# Patient Record
Sex: Male | Born: 1960 | Smoking: Former smoker
Health system: Southern US, Community
[De-identification: ages and names within clinical notes are randomized; demographics above are authoritative.]

## PROBLEM LIST (undated history)

## (undated) DIAGNOSIS — F419 Anxiety disorder, unspecified: Secondary | ICD-10-CM

## (undated) DIAGNOSIS — F329 Major depressive disorder, single episode, unspecified: Secondary | ICD-10-CM

## (undated) DIAGNOSIS — E785 Hyperlipidemia, unspecified: Secondary | ICD-10-CM

## (undated) DIAGNOSIS — F32A Depression, unspecified: Secondary | ICD-10-CM

## (undated) DIAGNOSIS — I10 Essential (primary) hypertension: Secondary | ICD-10-CM

## (undated) DIAGNOSIS — E119 Type 2 diabetes mellitus without complications: Secondary | ICD-10-CM

## (undated) HISTORY — DX: Essential (primary) hypertension: I10

## (undated) HISTORY — PX: PTERYGIUM EXCISION: SHX2273

## (undated) HISTORY — DX: Depression, unspecified: F32.A

## (undated) HISTORY — DX: Major depressive disorder, single episode, unspecified: F32.9

## (undated) HISTORY — DX: Hyperlipidemia, unspecified: E78.5

## (undated) HISTORY — DX: Anxiety disorder, unspecified: F41.9

## (undated) HISTORY — DX: Type 2 diabetes mellitus without complications: E11.9

---

## 2014-12-15 DIAGNOSIS — E785 Hyperlipidemia, unspecified: Secondary | ICD-10-CM | POA: Insufficient documentation

## 2014-12-15 DIAGNOSIS — I10 Essential (primary) hypertension: Secondary | ICD-10-CM | POA: Insufficient documentation

## 2014-12-15 DIAGNOSIS — F419 Anxiety disorder, unspecified: Secondary | ICD-10-CM | POA: Insufficient documentation

## 2014-12-15 DIAGNOSIS — E1142 Type 2 diabetes mellitus with diabetic polyneuropathy: Secondary | ICD-10-CM | POA: Insufficient documentation

## 2014-12-15 DIAGNOSIS — F339 Major depressive disorder, recurrent, unspecified: Secondary | ICD-10-CM | POA: Insufficient documentation

## 2014-12-23 ENCOUNTER — Ambulatory Visit (INDEPENDENT_AMBULATORY_CARE_PROVIDER_SITE_OTHER): Payer: BC Managed Care – PPO | Admitting: Family Medicine

## 2014-12-23 ENCOUNTER — Encounter: Payer: Self-pay | Admitting: Family Medicine

## 2014-12-23 VITALS — BP 136/81 | HR 58 | Temp 98.0°F | Ht 71.0 in | Wt 215.0 lb

## 2014-12-23 DIAGNOSIS — R972 Elevated prostate specific antigen [PSA]: Secondary | ICD-10-CM

## 2014-12-23 DIAGNOSIS — E119 Type 2 diabetes mellitus without complications: Secondary | ICD-10-CM | POA: Diagnosis not present

## 2014-12-23 DIAGNOSIS — E785 Hyperlipidemia, unspecified: Secondary | ICD-10-CM | POA: Diagnosis not present

## 2014-12-23 DIAGNOSIS — I10 Essential (primary) hypertension: Secondary | ICD-10-CM | POA: Diagnosis not present

## 2014-12-23 LAB — BAYER DCA HB A1C WAIVED: HB A1C (BAYER DCA - WAIVED): 6.1 % (ref <7.0)

## 2014-12-23 MED ORDER — ALBIGLUTIDE 50 MG ~~LOC~~ PEN: 50.0000 mg | PEN_INJECTOR | SUBCUTANEOUS | Status: DC

## 2014-12-23 MED ORDER — METFORMIN HCL ER (MOD) 500 MG PO TB24: 500.0000 mg | ORAL_TABLET | Freq: Every day | ORAL | Status: DC

## 2014-12-23 MED ORDER — ESOMEPRAZOLE MAGNESIUM 20 MG PO CPDR: 20.0000 mg | DELAYED_RELEASE_CAPSULE | Freq: Every day | ORAL | Status: DC

## 2014-12-23 MED ORDER — LOSARTAN POTASSIUM-HCTZ 100-12.5 MG PO TABS: 1.0000 | ORAL_TABLET | Freq: Every day | ORAL | Status: DC

## 2014-12-23 MED ORDER — AMLODIPINE BESYLATE 10 MG PO TABS: 10.0000 mg | ORAL_TABLET | Freq: Every day | ORAL | Status: DC

## 2014-12-23 MED ORDER — ATORVASTATIN CALCIUM 20 MG PO TABS: 20.0000 mg | ORAL_TABLET | Freq: Every day | ORAL | Status: DC

## 2014-12-23 NOTE — Assessment & Plan Note (Signed)
stable °

## 2014-12-23 NOTE — Progress Notes (Signed)
BP 136/81 mmHg  Pulse 58  Temp(Src) 98 F (36.7 C)  Ht  (1.803 m)  Wt 215 lb (97.523 kg)  BMI 30.00 kg/m2  SpO2 97%   Subjective:    Patient ID: Shawn Shaw, male    DOB: 01-Jul-1960, 54 y.o.   MRN: [id number]  HPI: Shawn Shaw is a 54 y.o. male  Chief Complaint  Patient presents with  . Diabetes  . check PSA   patient recheck diabetes has been doing very well especially with weekly shot has lost 10 pounds. Not having any side effects from medications. Did have to cut back on morning metformin to no tablet at all as was having low blood sugar spells. After stopping his blood sugars remained normal and he has had more energy during the day. Blood pressure cholesterol doing well with no complaints medications Takes medications faithfully with no side effects.  Relevant past medical, surgical, family and social history reviewed and updated as indicated. Interim medical history since our last visit reviewed. Allergies and medications reviewed and updated.  Review of Systems  Constitutional: Negative.   Respiratory: Negative.   Cardiovascular: Negative.     Per HPI unless specifically indicated above     Objective:    BP 136/81 mmHg  Pulse 58  Temp(Src) 98 F (36.7 C)  Ht  (1.803 m)  Wt 215 lb (97.523 kg)  BMI 30.00 kg/m2  SpO2 97%  Wt Readings from Last 3 Encounters:  12/23/14 215 lb (97.523 kg)  09/17/14 224 lb (101.606 kg)    Physical Exam  Constitutional: He is oriented to person, place, and time. He appears well-developed and well-nourished. No distress.  HENT:  Head: Normocephalic and atraumatic.  Right Ear: Hearing normal.  Left Ear: Hearing normal.  Nose: Nose normal.  Eyes: Conjunctivae and lids are normal. Right eye exhibits no discharge. Left eye exhibits no discharge. No scleral icterus.  Cardiovascular: Normal rate and regular rhythm.   Pulmonary/Chest: Effort normal and breath sounds normal. No respiratory distress.   Musculoskeletal: Normal range of motion.  Neurological: He is alert and oriented to person, place, and time.  Skin: Skin is intact. No rash noted.  Psychiatric: He has a normal mood and affect. His speech is normal and behavior is normal. Judgment and thought content normal. Cognition and memory are normal.    No results found for this or any previous visit.    Assessment & Plan:   Problem List Items Addressed This Visit      Cardiovascular and Mediastinum   Hypertension    The current medical regimen is effective;  continue present plan and medications.       Relevant Medications   amLODipine (NORVASC) 10 MG tablet   atorvastatin (LIPITOR) 20 MG tablet   losartan-hydrochlorothiazide (HYZAAR) 100-12.5 MG per tablet   Other Relevant Orders   Basic metabolic panel     Endocrine   Diabetes mellitus without complication   Relevant Medications   Albiglutide (TANZEUM) 50 MG PEN   atorvastatin (LIPITOR) 20 MG tablet   losartan-hydrochlorothiazide (HYZAAR) 100-12.5 MG per tablet   metFORMIN (GLUMETZA) 500 MG (MOD) 24 hr tablet   Other Relevant Orders   Bayer DCA Hb A1c Waived   Bayer DCA Hb A1c Waived     Other   Hyperlipidemia    stable      Relevant Medications   amLODipine (NORVASC) 10 MG tablet   atorvastatin (LIPITOR) 20 MG tablet   losartan-hydrochlorothiazide (HYZAAR) 100-12.5 MG per tablet  Other Relevant Orders   LP+ALT+AST+Glu Piccolo, [place]    Other Visit Diagnoses    Elevated PSA    -  Primary    Relevant Orders    PSA        Follow up plan: Return in about 3 months (around 03/25/2015) for f/u meds.

## 2014-12-23 NOTE — Assessment & Plan Note (Signed)
The current medical regimen is effective;  continue present plan and medications.  

## 2014-12-24 LAB — PSA: Prostate Specific Ag, Serum: 7.3 ng/mL — ABNORMAL HIGH (ref 0.0–4.0)

## 2014-12-24 NOTE — Progress Notes (Signed)
Phone call Discussed with patient elevated PSA even more so than last time Patient does not want [place] to make his appointment he will find out which urologist then asked for referral. He will be contacting us within the week.

## 2015-03-25 ENCOUNTER — Encounter: Payer: Self-pay | Admitting: Family Medicine

## 2015-03-25 ENCOUNTER — Ambulatory Visit (INDEPENDENT_AMBULATORY_CARE_PROVIDER_SITE_OTHER): Payer: BC Managed Care – PPO | Admitting: Family Medicine

## 2015-03-25 VITALS — BP 129/82 | HR 63 | Temp 97.3°F | Ht 70.1 in | Wt 218.0 lb

## 2015-03-25 DIAGNOSIS — Z23 Encounter for immunization: Secondary | ICD-10-CM

## 2015-03-25 DIAGNOSIS — I10 Essential (primary) hypertension: Secondary | ICD-10-CM | POA: Diagnosis not present

## 2015-03-25 DIAGNOSIS — E119 Type 2 diabetes mellitus without complications: Secondary | ICD-10-CM | POA: Diagnosis not present

## 2015-03-25 DIAGNOSIS — E785 Hyperlipidemia, unspecified: Secondary | ICD-10-CM | POA: Diagnosis not present

## 2015-03-25 NOTE — Assessment & Plan Note (Signed)
The current medical regimen is effective;  continue present plan and medications.  

## 2015-03-25 NOTE — Progress Notes (Signed)
BP 129/82 mmHg  Pulse 63  Temp(Src) 97.3 F (36.3 C)  Ht 5' 10.1" (1.781 m)  Wt 218 lb (98.884 kg)  BMI 31.17 kg/m2  SpO2 96%   Subjective:    Patient ID: Shawn Shaw, male    DOB: 07/25/1960, 54 y.o.   MRN: [id number]  HPI: Shawn Shaw is a 54 y.o. male  Chief Complaint  Patient presents with  . Diabetes  . Hyperlipidemia  . Hypertension   patient recheck diabetes doing well patient with no low blood sugar spells no side effects from medications and all in all doing well with medications Blood pressure doing well with no complaints from medicines takes faithfully Cholesterol doing well with no complaints from medications takes faithfully Went through prostate biopsy for BPH with negative biopsy reports and is recovered well  Relevant past medical, surgical, family and social history reviewed and updated as indicated. Interim medical history since our last visit reviewed. Allergies and medications reviewed and updated.  Review of Systems  Constitutional: Negative.   Respiratory: Negative.   Cardiovascular: Negative.     Per HPI unless specifically indicated above     Objective:    BP 129/82 mmHg  Pulse 63  Temp(Src) 97.3 F (36.3 C)  Ht 5' 10.1" (1.781 m)  Wt 218 lb (98.884 kg)  BMI 31.17 kg/m2  SpO2 96%  Wt Readings from Last 3 Encounters:  03/25/15 218 lb (98.884 kg)  12/23/14 215 lb (97.523 kg)  09/17/14 224 lb (101.606 kg)    Physical Exam  Constitutional: He is oriented to person, place, and time. He appears well-developed and well-nourished. No distress.  HENT:  Head: Normocephalic and atraumatic.  Right Ear: Hearing normal.  Left Ear: Hearing normal.  Nose: Nose normal.  Eyes: Conjunctivae and lids are normal. Right eye exhibits no discharge. Left eye exhibits no discharge. No scleral icterus.  Cardiovascular: Normal rate, regular rhythm and normal heart sounds.   Pulmonary/Chest: Effort normal and breath sounds normal. No respiratory  distress.  Musculoskeletal: Normal range of motion.  Neurological: He is alert and oriented to person, place, and time.  Skin: Skin is intact. No rash noted.  Psychiatric: He has a normal mood and affect. His speech is normal and behavior is normal. Judgment and thought content normal. Cognition and memory are normal.    Results for orders placed or performed in visit on 12/23/14  Bayer DCA Hb A1c Waived  Result Value Ref Range   Bayer DCA Hb A1c Waived 6.1 <7.0 %  PSA  Result Value Ref Range   Prostate Specific Ag, Serum 7.3 (H) 0.0 - 4.0 ng/mL      Assessment & Plan:   Problem List Items Addressed This Visit      Cardiovascular and Mediastinum   Hypertension    The current medical regimen is effective;  continue present plan and medications.         Endocrine   Diabetes mellitus without complication (HCC)    The current medical regimen is effective;  continue present plan and medications.       Relevant Orders   Microalbumin, Urine Waived     Other   Hyperlipidemia    The current medical regimen is effective;  continue present plan and medications.        Other Visit Diagnoses    Immunization due    -  Primary    Relevant Orders    Flu Vaccine QUAD 36+ mos PF IM (Fluarix & Fluzone Quad PF) (  Completed)        Follow up plan: Return in about 3 months (around 06/25/2015), or if symptoms worsen or fail to improve, for Physical Exam.

## 2015-03-26 LAB — BASIC METABOLIC PANEL
BUN / CREAT RATIO: 15 (ref 9–20)
BUN: 18 mg/dL (ref 6–24)
CO2: 24 mmol/L (ref 18–29)
Calcium: 9.7 mg/dL (ref 8.7–10.2)
Chloride: 97 mmol/L (ref 97–106)
Creatinine, Ser: 1.21 mg/dL (ref 0.76–1.27)
GFR calc non Af Amer: 67 mL/min/{1.73_m2} (ref >59)
GFR, EST AFRICAN AMERICAN: 77 mL/min/{1.73_m2} (ref >59)
Glucose: 118 mg/dL — ABNORMAL HIGH (ref 65–99)
POTASSIUM: 4 mmol/L (ref 3.5–5.2)
Sodium: 137 mmol/L (ref 136–144)

## 2015-03-26 LAB — BAYER DCA HB A1C WAIVED: HB A1C: 6.1 % (ref <7.0)

## 2015-03-26 LAB — LP+ALT+AST+GLU PICCOLO, WAIVED
ALT (SGPT) Piccolo, Waived: 39 U/L (ref 10–47)
AST (SGOT) Piccolo, Waived: 29 U/L (ref 11–38)
CHOL/HDL RATIO PICCOLO,WAIVE: 3.7 mg/dL
CHOLESTEROL PICCOLO, WAIVED: 119 mg/dL (ref <200)
Glucose Piccolo, Waived: 125 mg/dL — ABNORMAL HIGH (ref 73–118)
HDL Chol Piccolo, Waived: 32 mg/dL — ABNORMAL LOW (ref >59)
LDL Chol Calc Piccolo Waived: 36 mg/dL (ref <100)
TRIGLYCERIDES PICCOLO,WAIVED: 254 mg/dL — AB (ref <150)
VLDL Chol Calc Piccolo,Waive: 51 mg/dL — ABNORMAL HIGH (ref <30)

## 2015-03-26 LAB — MICROALBUMIN, URINE WAIVED
Creatinine, Urine Waived: 50 mg/dL (ref 10–300)
Microalb, Ur Waived: 10 mg/L (ref 0–19)
Microalb/Creat Ratio: <30 mg/g (ref <30)

## 2015-03-29 ENCOUNTER — Encounter: Payer: Self-pay | Admitting: Family Medicine

## 2015-05-30 ENCOUNTER — Other Ambulatory Visit: Payer: Self-pay | Admitting: Family Medicine

## 2015-05-30 NOTE — Telephone Encounter (Signed)
Patient's last two A1c's were 6.1; I'll call on Monday and see if he's interested in stopping this ($$$) and maximizing metformin

## 2015-05-31 MED ORDER — METFORMIN HCL ER (MOD) 500 MG PO TB24: 500.0000 mg | ORAL_TABLET | Freq: Two times a day (BID) | ORAL | Status: DC

## 2015-05-31 NOTE — Telephone Encounter (Signed)
I talked with patient; I suggested we stop the Tanzeum and increase metformin to save $$$ He just picked up his Tanzeum, so he'll finish it out and then go to metformin BID New Rx of metformin sent to pharmacy

## 2015-07-08 ENCOUNTER — Ambulatory Visit (INDEPENDENT_AMBULATORY_CARE_PROVIDER_SITE_OTHER): Payer: BC Managed Care – PPO | Admitting: Family Medicine

## 2015-07-08 ENCOUNTER — Encounter: Payer: Self-pay | Admitting: Family Medicine

## 2015-07-08 VITALS — BP 132/82 | HR 59 | Temp 97.6°F | Ht 70.8 in | Wt 223.0 lb

## 2015-07-08 DIAGNOSIS — Z0000 Encounter for general adult medical examination without abnormal findings: Secondary | ICD-10-CM | POA: Diagnosis not present

## 2015-07-08 DIAGNOSIS — E785 Hyperlipidemia, unspecified: Secondary | ICD-10-CM

## 2015-07-08 DIAGNOSIS — Z113 Encounter for screening for infections with a predominantly sexual mode of transmission: Secondary | ICD-10-CM | POA: Diagnosis not present

## 2015-07-08 DIAGNOSIS — Z23 Encounter for immunization: Secondary | ICD-10-CM

## 2015-07-08 DIAGNOSIS — E119 Type 2 diabetes mellitus without complications: Secondary | ICD-10-CM

## 2015-07-08 DIAGNOSIS — I10 Essential (primary) hypertension: Secondary | ICD-10-CM

## 2015-07-08 LAB — URINALYSIS, ROUTINE W REFLEX MICROSCOPIC
Bilirubin, UA: NEGATIVE
Glucose, UA: NEGATIVE
KETONES UA: NEGATIVE
Leukocytes, UA: NEGATIVE
NITRITE UA: NEGATIVE
Protein, UA: NEGATIVE
RBC, UA: NEGATIVE
Specific Gravity, UA: 1.015 (ref 1.005–1.030)
UUROB: 0.2 mg/dL (ref 0.2–1.0)
pH, UA: 6 (ref 5.0–7.5)

## 2015-07-08 LAB — BAYER DCA HB A1C WAIVED: HB A1C (BAYER DCA - WAIVED): 6.4 % (ref <7.0)

## 2015-07-08 MED ORDER — AMLODIPINE BESYLATE 10 MG PO TABS: 10.0000 mg | ORAL_TABLET | Freq: Every day | ORAL | Status: DC

## 2015-07-08 MED ORDER — ALBIGLUTIDE 50 MG ~~LOC~~ PEN: PEN_INJECTOR | SUBCUTANEOUS | Status: DC

## 2015-07-08 MED ORDER — ATORVASTATIN CALCIUM 20 MG PO TABS: 20.0000 mg | ORAL_TABLET | Freq: Every day | ORAL | Status: DC

## 2015-07-08 MED ORDER — METFORMIN HCL ER (MOD) 500 MG PO TB24: 500.0000 mg | ORAL_TABLET | Freq: Two times a day (BID) | ORAL | Status: DC

## 2015-07-08 MED ORDER — LOSARTAN POTASSIUM-HCTZ 100-12.5 MG PO TABS: 1.0000 | ORAL_TABLET | Freq: Every day | ORAL | Status: DC

## 2015-07-08 MED ORDER — ESOMEPRAZOLE MAGNESIUM 20 MG PO CPDR: 20.0000 mg | DELAYED_RELEASE_CAPSULE | Freq: Every day | ORAL | Status: DC

## 2015-07-08 NOTE — Assessment & Plan Note (Signed)
The current medical regimen is effective;  continue present plan and medications.  

## 2015-07-08 NOTE — Progress Notes (Signed)
BP 132/82 mmHg  Pulse 59  Temp(Src) 97.6 F (36.4 C)  Ht 5' 10.8" (1.798 m)  Wt 223 lb (101.152 kg)  BMI 31.29 kg/m2   Subjective:    Patient ID: Shawn Shaw, male    DOB: 10/12/1960, 55 y.o.   MRN: [id number]  HPI: Shawn Shaw is a 55 y.o. male  Chief Complaint  Patient presents with  . Annual Exam  . Diabetes   patient all in all doing well blood pressure no complaints taking medications faithfully Cholesterol no problems no side effects from medications Diabetes noted low blood sugar spells problems issues Prostate followed by [facility] urology will have prostate exam next month.  Relevant past medical, surgical, family and social history reviewed and updated as indicated. Interim medical history since our last visit reviewed. Allergies and medications reviewed and updated.  Review of Systems  Constitutional: Negative.   HENT: Negative.   Eyes: Negative.   Respiratory: Negative.   Cardiovascular: Negative.   Gastrointestinal: Negative.   Endocrine: Negative.   Genitourinary: Negative.   Musculoskeletal: Negative.   Skin: Negative.   Allergic/Immunologic: Negative.   Neurological: Negative.   Hematological: Negative.   Psychiatric/Behavioral: Negative.     Per HPI unless specifically indicated above     Objective:    BP 132/82 mmHg  Pulse 59  Temp(Src) 97.6 F (36.4 C)  Ht 5' 10.8" (1.798 m)  Wt 223 lb (101.152 kg)  BMI 31.29 kg/m2  Wt Readings from Last 3 Encounters:  07/08/15 223 lb (101.152 kg)  03/25/15 218 lb (98.884 kg)  12/23/14 215 lb (97.523 kg)    Physical Exam  Constitutional: He is oriented to person, place, and time. He appears well-developed and well-nourished.  HENT:  Head: Normocephalic.  Right Ear: External ear normal.  Left Ear: External ear normal.  Nose: Nose normal.  Eyes: Conjunctivae and EOM are normal. Pupils are equal, round, and reactive to light.  Neck: Normal range of motion. Neck supple. No thyromegaly present.   Cardiovascular: Normal rate, regular rhythm, normal heart sounds and intact distal pulses.   Pulmonary/Chest: Effort normal and breath sounds normal.  Abdominal: Soft. Bowel sounds are normal. There is no splenomegaly or hepatomegaly.  Genitourinary: Penis normal.  Musculoskeletal: Normal range of motion.  Lymphadenopathy:    He has no cervical adenopathy.  Neurological: He is alert and oriented to person, place, and time. He has normal reflexes.  Skin: Skin is warm and dry.  Psychiatric: He has a normal mood and affect. His behavior is normal. Judgment and thought content normal.    Results for orders placed or performed in visit on 03/25/15  Bayer DCA Hb A1c Waived  Result Value Ref Range   Bayer DCA Hb A1c Waived 6.1 <7.0 %  Basic metabolic panel  Result Value Ref Range   Glucose 118 (H) 65 - 99 mg/dL   BUN 18 6 - 24 mg/dL   Creatinine, Ser 4.09 0.76 - 1.27 mg/dL   GFR calc non Af Amer 67 >59 mL/min/1.73   GFR calc Af Amer 77 >59 mL/min/1.73   BUN/Creatinine Ratio 15 9 - 20   Sodium 137 136 - 144 mmol/L   Potassium 4.0 3.5 - 5.2 mmol/L   Chloride 97 97 - 106 mmol/L   CO2 24 18 - 29 mmol/L   Calcium 9.7 8.7 - 10.2 mg/dL  LP+ALT+AST+Glu Piccolo, Waived  Result Value Ref Range   ALT (SGPT) Piccolo, Waived 39 10 - 47 U/L   AST (SGOT) Piccolo, [employer]  29 11 - 38 U/L   Glucose Piccolo, Waived 125 (H) 73 - 118 mg/dL   Cholesterol Piccolo, Waived 119 <200 mg/dL   HDL Chol Piccolo, Waived 32 (L) >59 mg/dL   Triglycerides Piccolo,Waived 254 (H) <150 mg/dL   Chol/HDL Ratio Piccolo,Waive 3.7 mg/dL   LDL Chol Calc Piccolo Waived 36 <100 mg/dL   VLDL Chol Calc Piccolo,Waive 51 (H) <30 mg/dL  Microalbumin, Urine Waived  Result Value Ref Range   Microalb, Ur Waived 10 0 - 19 mg/L   Creatinine, Urine Waived 50 10 - 300 mg/dL   Microalb/Creat Ratio <30 <30 mg/g      Assessment & Plan:   Problem List Items Addressed This Visit      Cardiovascular and Mediastinum   Hypertension     The current medical regimen is effective;  continue present plan and medications.       Relevant Medications   losartan-hydrochlorothiazide (HYZAAR) 100-12.5 MG tablet   atorvastatin (LIPITOR) 20 MG tablet   amLODipine (NORVASC) 10 MG tablet     Endocrine   Diabetes mellitus without complication (HCC)    The current medical regimen is effective;  continue present plan and medications.       Relevant Medications   metFORMIN (GLUMETZA) 500 MG (MOD) 24 hr tablet   losartan-hydrochlorothiazide (HYZAAR) 100-12.5 MG tablet   atorvastatin (LIPITOR) 20 MG tablet   Albiglutide 50 MG PEN   Other Relevant Orders   Bayer DCA Hb A1c Waived     Other   Hyperlipidemia    The current medical regimen is effective;  continue present plan and medications.       Relevant Medications   losartan-hydrochlorothiazide (HYZAAR) 100-12.5 MG tablet   atorvastatin (LIPITOR) 20 MG tablet   amLODipine (NORVASC) 10 MG tablet    Other Visit Diagnoses    Routine general medical examination at a health care facility    -  Primary    Relevant Orders    CBC with Differential/Platelet    Comprehensive metabolic panel    Lipid Panel w/o Chol/HDL Ratio    TSH    Urinalysis, Routine w reflex microscopic (not at [facility])    Routine screening for STI (sexually transmitted infection)        Relevant Orders    Hepatitis C Antibody    Immunization due        Relevant Orders    Tdap vaccine greater than or equal to 7yo IM (Completed)        Follow up plan: Return in about 3 months (around 10/05/2015) for a1c.

## 2015-07-09 LAB — COMPREHENSIVE METABOLIC PANEL
ALT: 39 IU/L (ref 0–44)
AST: 25 IU/L (ref 0–40)
Albumin/Globulin Ratio: 1.6 (ref 1.1–2.5)
Albumin: 4.5 g/dL (ref 3.5–5.5)
Alkaline Phosphatase: 77 IU/L (ref 39–117)
BUN/Creatinine Ratio: 12 (ref 9–20)
BUN: 15 mg/dL (ref 6–24)
Bilirubin Total: 0.5 mg/dL (ref 0.0–1.2)
CALCIUM: 9.9 mg/dL (ref 8.7–10.2)
CO2: 26 mmol/L (ref 18–29)
CREATININE: 1.28 mg/dL — AB (ref 0.76–1.27)
Chloride: 99 mmol/L (ref 96–106)
GFR calc Af Amer: 72 mL/min/{1.73_m2} (ref >59)
GFR, EST NON AFRICAN AMERICAN: 63 mL/min/{1.73_m2} (ref >59)
GLOBULIN, TOTAL: 2.8 g/dL (ref 1.5–4.5)
Glucose: 84 mg/dL (ref 65–99)
Potassium: 4.3 mmol/L (ref 3.5–5.2)
Sodium: 139 mmol/L (ref 134–144)
Total Protein: 7.3 g/dL (ref 6.0–8.5)

## 2015-07-09 LAB — CBC WITH DIFFERENTIAL/PLATELET
Basophils Absolute: 0 10*3/uL (ref 0.0–0.2)
Basos: 0 %
EOS (ABSOLUTE): 0.2 10*3/uL (ref 0.0–0.4)
EOS: 3 %
HEMATOCRIT: 43.9 % (ref 37.5–51.0)
Hemoglobin: 15.2 g/dL (ref 12.6–17.7)
IMMATURE GRANULOCYTES: 0 %
Immature Grans (Abs): 0 10*3/uL (ref 0.0–0.1)
LYMPHS: 22 %
Lymphocytes Absolute: 1.8 10*3/uL (ref 0.7–3.1)
MCH: 30.3 pg (ref 26.6–33.0)
MCHC: 34.6 g/dL (ref 31.5–35.7)
MCV: 88 fL (ref 79–97)
MONOS ABS: 0.8 10*3/uL (ref 0.1–0.9)
Monocytes: 10 %
NEUTROS PCT: 65 %
Neutrophils Absolute: 5.3 10*3/uL (ref 1.4–7.0)
PLATELETS: 340 10*3/uL (ref 150–379)
RBC: 5.01 x10E6/uL (ref 4.14–5.80)
RDW: 13.8 % (ref 12.3–15.4)
WBC: 8.2 10*3/uL (ref 3.4–10.8)

## 2015-07-09 LAB — HEPATITIS C ANTIBODY: Hep C Virus Ab: <0.1 s/co ratio (ref 0.0–0.9)

## 2015-07-09 LAB — LIPID PANEL W/O CHOL/HDL RATIO
Cholesterol, Total: 118 mg/dL (ref 100–199)
HDL: 32 mg/dL — AB (ref >39)
LDL CALC: 49 mg/dL (ref 0–99)
TRIGLYCERIDES: 186 mg/dL — AB (ref 0–149)
VLDL Cholesterol Cal: 37 mg/dL (ref 5–40)

## 2015-07-09 LAB — TSH: TSH: 1.86 u[IU]/mL (ref 0.450–4.500)

## 2015-07-12 ENCOUNTER — Encounter: Payer: Self-pay | Admitting: Family Medicine

## 2015-10-07 ENCOUNTER — Ambulatory Visit (INDEPENDENT_AMBULATORY_CARE_PROVIDER_SITE_OTHER): Payer: BC Managed Care – PPO | Admitting: Family Medicine

## 2015-10-07 ENCOUNTER — Encounter: Payer: Self-pay | Admitting: Family Medicine

## 2015-10-07 VITALS — BP 113/72 | HR 59 | Temp 97.8°F | Ht 70.8 in | Wt 227.0 lb

## 2015-10-07 DIAGNOSIS — E119 Type 2 diabetes mellitus without complications: Secondary | ICD-10-CM

## 2015-10-07 DIAGNOSIS — I10 Essential (primary) hypertension: Secondary | ICD-10-CM

## 2015-10-07 LAB — BAYER DCA HB A1C WAIVED: HB A1C: 6.6 % (ref <7.0)

## 2015-10-07 MED ORDER — TRIAMCINOLONE ACETONIDE 0.1 % EX CREA: 1.0000 "application " | TOPICAL_CREAM | Freq: Two times a day (BID) | CUTANEOUS | Status: DC

## 2015-10-07 MED ORDER — EXENATIDE ER 2 MG ~~LOC~~ PEN: 2.0000 mg | PEN_INJECTOR | SUBCUTANEOUS | Status: DC

## 2015-10-07 NOTE — Assessment & Plan Note (Signed)
The current medical regimen is effective;  continue present plan and medications.  

## 2015-10-07 NOTE — Progress Notes (Signed)
BP 113/72 mmHg  Pulse 59  Temp(Src) 97.8 F (36.6 C)  Ht 5' 10.8" (1.798 m)  Wt 227 lb (102.967 kg)  BMI 31.85 kg/m2  SpO2 98%   Subjective:    Patient ID: Shawn Shaw, male    DOB: 04/14/1961, 55 y.o.   MRN: [id number]  HPI: Shawn Shaw is a 55 y.o. male  Chief Complaint  Patient presents with  . Diabetes  Patient recheck diabetes doing well concerned has been eating a lot and gained 9 pounds in the last 6 months. Patient's blood pressure doing well no complaints Prostate maybe some shrinking some No low blood sugar spells no issues taking medications or with compliance. No side effects  Relevant past medical, surgical, family and social history reviewed and updated as indicated. Interim medical history since our last visit reviewed. Allergies and medications reviewed and updated.  Review of Systems  Constitutional: Negative.   Respiratory: Negative.   Cardiovascular: Negative.     Per HPI unless specifically indicated above     Objective:    BP 113/72 mmHg  Pulse 59  Temp(Src) 97.8 F (36.6 C)  Ht 5' 10.8" (1.798 m)  Wt 227 lb (102.967 kg)  BMI 31.85 kg/m2  SpO2 98%  Wt Readings from Last 3 Encounters:  10/07/15 227 lb (102.967 kg)  07/08/15 223 lb (101.152 kg)  03/25/15 218 lb (98.884 kg)    Physical Exam  Constitutional: He is oriented to person, place, and time. He appears well-developed and well-nourished. No distress.  HENT:  Head: Normocephalic and atraumatic.  Right Ear: Hearing normal.  Left Ear: Hearing normal.  Nose: Nose normal.  Eyes: Conjunctivae and lids are normal. Right eye exhibits no discharge. Left eye exhibits no discharge. No scleral icterus.  Cardiovascular: Normal rate, regular rhythm and normal heart sounds.   Pulmonary/Chest: Effort normal and breath sounds normal. No respiratory distress.  Musculoskeletal: Normal range of motion.  Neurological: He is alert and oriented to person, place, and time.  Skin: Skin is intact.  No rash noted.  Psychiatric: He has a normal mood and affect. His speech is normal and behavior is normal. Judgment and thought content normal. Cognition and memory are normal.    Results for orders placed or performed in visit on 07/08/15  CBC with Differential/Platelet  Result Value Ref Range   WBC 8.2 3.4 - 10.8 x10E3/uL   RBC 5.01 4.14 - 5.80 x10E6/uL   Hemoglobin 15.2 12.6 - 17.7 g/dL   Hematocrit 63.0 16.0 - 51.0 %   MCV 88 79 - 97 fL   MCH 30.3 26.6 - 33.0 pg   MCHC 34.6 31.5 - 35.7 g/dL   RDW 10.9 32.3 - 55.7 %   Platelets 340 150 - 379 x10E3/uL   Neutrophils 65 %   Lymphs 22 %   Monocytes 10 %   Eos 3 %   Basos 0 %   Neutrophils Absolute 5.3 1.4 - 7.0 x10E3/uL   Lymphocytes Absolute 1.8 0.7 - 3.1 x10E3/uL   Monocytes Absolute 0.8 0.1 - 0.9 x10E3/uL   EOS (ABSOLUTE) 0.2 0.0 - 0.4 x10E3/uL   Basophils Absolute 0.0 0.0 - 0.2 x10E3/uL   Immature Granulocytes 0 %   Immature Grans (Abs) 0.0 0.0 - 0.1 x10E3/uL  Comprehensive metabolic panel  Result Value Ref Range   Glucose 84 65 - 99 mg/dL   BUN 15 6 - 24 mg/dL   Creatinine, Ser 3.22 (H) 0.76 - 1.27 mg/dL   GFR calc non Af Amer 63 >  59 mL/min/1.73   GFR calc Af Amer 72 >59 mL/min/1.73   BUN/Creatinine Ratio 12 9 - 20   Sodium 139 134 - 144 mmol/L   Potassium 4.3 3.5 - 5.2 mmol/L   Chloride 99 96 - 106 mmol/L   CO2 26 18 - 29 mmol/L   Calcium 9.9 8.7 - 10.2 mg/dL   Total Protein 7.3 6.0 - 8.5 g/dL   Albumin 4.5 3.5 - 5.5 g/dL   Globulin, Total 2.8 1.5 - 4.5 g/dL   Albumin/Globulin Ratio 1.6 1.1 - 2.5   Bilirubin Total 0.5 0.0 - 1.2 mg/dL   Alkaline Phosphatase 77 39 - 117 IU/L   AST 25 0 - 40 IU/L   ALT 39 0 - 44 IU/L  Lipid Panel w/o Chol/HDL Ratio  Result Value Ref Range   Cholesterol, Total 118 100 - 199 mg/dL   Triglycerides 147186 (H) 0 - 149 mg/dL   HDL 32 (L) >82>39 mg/dL   VLDL Cholesterol Cal 37 5 - 40 mg/dL   LDL Calculated 49 0 - 99 mg/dL  TSH  Result Value Ref Range   TSH 1.860 0.450 - 4.500 uIU/mL   Urinalysis, Routine w reflex microscopic ([facility])  Result Value Ref Range   Specific Gravity, UA 1.015 1.005 - 1.030   pH, UA 6.0 5.0 - 7.5   Color, UA Yellow Yellow   Appearance Ur Clear Clear   Leukocytes, UA Negative Negative   Protein, UA Negative Negative/Trace   Glucose, UA Negative Negative   Ketones, UA Negative Negative   RBC, UA Negative Negative   Bilirubin, UA Negative Negative   Urobilinogen, Ur 0.2 0.2 - 1.0 mg/dL   Nitrite, UA Negative Negative  Bayer DCA Hb A1c Waived  Result Value Ref Range   Bayer DCA Hb A1c Waived 6.4 <7.0 %  Hepatitis C Antibody  Result Value Ref Range   Hep C Virus Ab <0.1 0.0 - 0.9 s/co ratio      Assessment & Plan:   Problem List Items Addressed This Visit      Cardiovascular and Mediastinum   Hypertension    The current medical regimen is effective;  continue present plan and medications.         Endocrine   Diabetes mellitus without complication (HCC) - Primary    The current medical regimen is effective;  continue present plan and medications. A cause of pharmacy benefit plan Peruanzanian is not being covered will switch to Bydureon gave coupons       Relevant Medications   Exenatide ER 2 MG PEN   Other Relevant Orders   Bayer DCA Hb A1c Waived       Follow up plan: Return in about 3 months (around 01/07/2016) for Lipids, ALT, AST, BMP, A1c.

## 2015-10-07 NOTE — Assessment & Plan Note (Addendum)
The current medical regimen is effective;  continue present plan and medications. A cause of pharmacy benefit plan Peruanzanian is not being covered will switch to Bydureon gave coupons

## 2015-10-07 NOTE — Addendum Note (Signed)
Addended byVonita Moss: Nayely Dingus on: 10/07/2015 03:59 PM   Modules accepted: Orders

## 2015-10-26 ENCOUNTER — Encounter: Payer: Self-pay | Admitting: Family Medicine

## 2015-10-26 ENCOUNTER — Telehealth: Payer: Self-pay | Admitting: Family Medicine

## 2015-10-26 NOTE — Telephone Encounter (Signed)
Please see if patient can be seen ASAP, or needs to go urgent care

## 2015-10-27 ENCOUNTER — Encounter: Payer: Self-pay | Admitting: Family Medicine

## 2015-10-27 ENCOUNTER — Ambulatory Visit (INDEPENDENT_AMBULATORY_CARE_PROVIDER_SITE_OTHER): Payer: BC Managed Care – PPO | Admitting: Family Medicine

## 2015-10-27 ENCOUNTER — Telehealth: Payer: Self-pay | Admitting: Family Medicine

## 2015-10-27 ENCOUNTER — Ambulatory Visit
Admission: RE | Admit: 2015-10-27 | Discharge: 2015-10-27 | Disposition: A | Discharge status: Home / Self Care | Payer: BC Managed Care – PPO | Source: Ambulatory Visit | Attending: Family Medicine | Admitting: Family Medicine

## 2015-10-27 VITALS — BP 137/75 | HR 56 | Temp 98.3°F | Wt 221.0 lb

## 2015-10-27 DIAGNOSIS — M25539 Pain in unspecified wrist: Secondary | ICD-10-CM

## 2015-10-27 DIAGNOSIS — M25532 Pain in left wrist: Secondary | ICD-10-CM | POA: Insufficient documentation

## 2015-10-27 MED ORDER — HYDROCODONE-ACETAMINOPHEN 10-325 MG PO TABS: 1.0000 | ORAL_TABLET | Freq: Four times a day (QID) | ORAL | Status: DC | PRN

## 2015-10-27 NOTE — Telephone Encounter (Signed)
Called and let him know that his x-ray was normal.
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
## 2015-11-10 ENCOUNTER — Encounter: Payer: Self-pay | Admitting: Family Medicine

## 2015-11-10 ENCOUNTER — Ambulatory Visit (INDEPENDENT_AMBULATORY_CARE_PROVIDER_SITE_OTHER): Payer: BC Managed Care – PPO | Admitting: Family Medicine

## 2015-11-10 DIAGNOSIS — S060X0D Concussion without loss of consciousness, subsequent encounter: Secondary | ICD-10-CM

## 2015-11-10 MED ORDER — HYDROCODONE-ACETAMINOPHEN 10-325 MG PO TABS: 1.0000 | ORAL_TABLET | Freq: Four times a day (QID) | ORAL | Status: DC | PRN

## 2015-11-10 NOTE — Patient Instructions (Signed)
Concussion, Adult A concussion is a brain injury. It is caused by:  A hit to the head.  A quick and sudden movement (jolt) of the head or neck. A concussion is usually not life threatening. Even so, it can cause serious problems. If you had a concussion before, you may have concussion-like problems after a hit to your head. HOME CARE General Instructions  Follow your doctor's directions carefully.  Take medicines only as told by your doctor.  Only take medicines your doctor says are safe.  Do not drink alcohol until your doctor says it is okay. Alcohol and some drugs can slow down healing. They can also put you at risk for further injury.  If you are having trouble remembering things, write them down.  Try to do one thing at a time if you get distracted easily. For example, do not watch TV while making dinner.  Talk to your family members or close friends when making important decisions.  Follow up with your doctor as told.  Watch your symptoms. Tell others to do the same. Serious problems can sometimes happen after a concussion. Older adults are more likely to have these problems.  Tell your teachers, school nurse, school counselor, coach, Event organiser, or work Production designer, theatre/television/film about your concussion. Tell them about what you can or cannot do. They should watch to see if:  It gets even harder for you to pay attention or concentrate.  It gets even harder for you to remember things or learn new things.  You need more time than normal to finish things.  You become annoyed (irritable) more than before.  You are not able to deal with stress as well.  You have more problems than before.  Rest. Make sure you:  Get plenty of sleep at night.  Go to sleep early.  Go to bed at the same time every day. Try to wake up at the same time.  Rest during the day.  Take naps when you feel tired.  Limit activities where you have to think a lot or concentrate. These include:  Doing  homework.  Doing work related to a job.  Watching TV.  Using the computer. Returning To Your Regular Activities Return to your normal activities slowly, not all at once. You must give your body and brain enough time to heal.   Do not play sports or do other athletic activities until your doctor says it is okay.  Ask your doctor when you can drive, ride a bicycle, or work other vehicles or machines. Never do these things if you feel dizzy.  Ask your doctor about when you can return to work or school. Preventing Another Concussion It is very important to avoid another brain injury, especially before you have healed. In rare cases, another injury can lead to permanent brain damage, brain swelling, or death. The risk of this is greatest during the first 7-10 days after your injury. Avoid injuries by:   Wearing a seat belt when riding in a car.  Not drinking too much alcohol.  Avoiding activities that could lead to a second concussion (such as contact sports).  Wearing a helmet when doing activities like:  Biking.  Skiing.  Skateboarding.  Skating.  Making your home safer by:  Removing things from the floor or stairways that could make you trip.  Using grab bars in bathrooms and handrails by stairs.  Placing non-slip mats on floors and in bathtubs.  Improve lighting in dark areas. GET HELP IF:  It gets even harder for you to pay attention or concentrate.  It gets even harder for you to remember things or learn new things.  You need more time than normal to finish things.  You become annoyed (irritable) more than before.  You are not able to deal with stress as well.  You have more problems than before.  You have problems keeping your balance.  You are not able to react quickly when you should. Get help if you have any of these problems for more than 2 weeks:   Lasting (chronic) headaches.  Dizziness or trouble balancing.  Feeling sick to your stomach  (nausea).  Seeing (vision) problems.  Being affected by noises or light more than normal.  Feeling sad, low, down in the dumps, blue, gloomy, or empty (depressed).  Mood changes (mood swings).  Feeling of fear or nervousness about what may happen (anxiety).  Feeling annoyed.  Memory problems.  Problems concentrating or paying attention.  Sleep problems.  Feeling tired all the time. GET HELP RIGHT AWAY IF:   You have bad headaches or your headaches get worse.  You have weakness (even if it is in one hand, leg, or part of the face).  You have loss of feeling (numbness).  You feel off balance.  You keep throwing up (vomiting).  You feel tired.  One black center of your eye (pupil) is larger than the other.  You twitch or shake violently (convulse).  Your speech is not clear (slurred).  You are more confused, easily angered (agitated), or annoyed than before.  You have more trouble resting than before.  You are unable to recognize people or places.  You have neck pain.  It is difficult to wake you up.  You have unusual behavior changes.  You pass out (lose consciousness). MAKE SURE YOU:   Understand these instructions.  Will watch your condition.  Will get help right away if you are not doing well or get worse.   This information is not intended to replace advice given to you by your health care provider. Make sure you discuss any questions you have with your health care provider.   Document Released: 04/19/2009 Document Revised: 05/22/2014 Document Reviewed: 11/21/2012 Elsevier Interactive Patient Education 2016 Elsevier Inc. Periosteal Hematoma Periosteal hematoma (bone bruise) is a localized, tender, raised area close to the bone. It can occur from a small hidden fracture of the bone, following surgery, or from other trauma to the area. It typically occurs in bones located close to the surface of the skin, such as the shin, knee, and heel bone.  Although it may take 2 or more weeks to completely heal, bone bruises typically are not associated with permanent or serious damage to the bone. If you are taking blood thinners, you may be at greater risk for such injuries.  CAUSES  A bone bruise is usually caused by high-impact trauma to the bone, but it can be caused by sports injuries or twisting injuries. SIGNS AND SYMPTOMS   Severe pain around the injured area that typically lasts longer than a normal bruise.  Difficulty using the bruised area.  Tender, raised area close to the bone.  Discoloration or swelling of the bruised area. DIAGNOSIS  You may need an MRI of the injured area to confirm a bone bruise if your health care provider feels it is necessary. A regular X-ray will not detect a bone bruise, but it will detect a broken bone (fracture). An X-ray may be taken  to rule out any fractures. TREATMENT  Often, the best treatment for a bone bruise is resting, icing, and applying cold compresses to the injured area. Over-the-counter medicines may also be recommended for pain control. HOME CARE INSTRUCTIONS  Some things you can do to improve the condition are:   Rest and elevate the area of injury as long as it is very tender or swollen.  Apply ice to the injured area:  Put ice in a plastic bag.  Place a towel between your skin and the bag.  Leave the ice on for 20 minutes, 2-3 times a day.  Use an elastic wrap to reduce swelling and protect the injured area. Make sure it is not applied too tightly. If the area around the wrap becomes cold or blue, the wrap is too tight. Wrap it more loosely.  For activity:  Follow your health care provider's instructions about whether walking with crutches is required. This will depend on how serious your condition is.  Start weight bearing gradually on the bruised part.  Continue to use crutches or a cane until you can stand without causing pain, or as instructed.  If a plaster splint  was applied:  Wear the splint until you are seen for a follow-up exam.  Rest it on nothing harder than a pillow the first 24 hours.  Do not put weight on it.  Do not get it wet. You may take it off to take a shower or bath.  You may have been given an elastic bandage to use with or without the plaster splint. The splint is too tight if you have numbness or tingling, or if the skin around the bandage becomes cold and blue. Adjust the bandage to make it comfortable.  If an air splint was applied:  You may alter the amount of air in the splint as needed for comfort.  You may take it off at night and to take a shower or bath.  If the injury was in either leg, wiggle your toes in the splint several times per day if you are able.  Only take over-the-counter or prescription medicines for pain, discomfort, or fever as directed by your health care provider.  Keep all follow-up visits with your health care provider. This includes any orthopedic referrals, physical therapy, and rehabilitation. Any delay in getting necessary care could result in a delay or failure of the bones to heal. SEEK MEDICAL CARE IF:   You have an increase in bruising, swelling, tenderness, heat, or pain over your injury.  You notice coldness of your toes that does not improve after removing a splint or bandage.  Your pain is not lessened after you take medicine.  You have increased difficulty bearing weight on the injured leg, if the injury is in either leg. SEEK IMMEDIATE MEDICAL CARE IF:   You have severe pain near the injured area or severe pain with stretching.  You have increased swelling that resulted in a tense, hard area or a loss of sensation in the area of the injury.  You have pale, cool skin below the area of the injury (in an extremity) that does not go away after removing a splint or bandage. MAKE SURE YOU:   Understand these instructions.  Will watch your condition.  Will get help right away if  you are not doing well or get worse.   This information is not intended to replace advice given to you by your health care provider. Make sure you discuss any  questions you have with your health care provider.   Document Released: 06/08/2004 Document Revised: 02/19/2013 Document Reviewed: 10/18/2012 Elsevier Interactive Patient Education [employer]2016 Elsevier Inc.
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
## 2015-11-25 ENCOUNTER — Encounter: Payer: Self-pay | Admitting: Family Medicine

## 2015-11-25 ENCOUNTER — Ambulatory Visit (INDEPENDENT_AMBULATORY_CARE_PROVIDER_SITE_OTHER): Payer: BC Managed Care – PPO | Admitting: Family Medicine

## 2015-11-25 ENCOUNTER — Ambulatory Visit: Payer: BC Managed Care – PPO | Admitting: Family Medicine

## 2015-11-25 VITALS — BP 132/79 | HR 54 | Temp 98.3°F | Wt 214.0 lb

## 2015-11-25 DIAGNOSIS — S060X0D Concussion without loss of consciousness, subsequent encounter: Secondary | ICD-10-CM | POA: Diagnosis not present

## 2015-11-25 DIAGNOSIS — M25562 Pain in left knee: Secondary | ICD-10-CM

## 2015-11-25 MED ORDER — HYDROCODONE-ACETAMINOPHEN 10-325 MG PO TABS: 1.0000 | ORAL_TABLET | Freq: Four times a day (QID) | ORAL | Status: DC | PRN
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
## 2015-11-26 ENCOUNTER — Ambulatory Visit
Admission: RE | Admit: 2015-11-26 | Discharge: 2015-11-26 | Disposition: A | Discharge status: Home / Self Care | Payer: BC Managed Care – PPO | Source: Ambulatory Visit | Attending: Family Medicine | Admitting: Family Medicine

## 2015-11-26 ENCOUNTER — Telehealth: Payer: Self-pay | Admitting: Family Medicine

## 2015-11-26 DIAGNOSIS — M25562 Pain in left knee: Secondary | ICD-10-CM | POA: Diagnosis present

## 2015-11-26 MED ORDER — LORAZEPAM 0.5 MG PO TABS: ORAL_TABLET | ORAL | Status: DC

## 2015-11-26 NOTE — Telephone Encounter (Signed)
Order in. Should get anti-anxiety from the MRI tech. Please call in to be safe

## 2015-11-26 NOTE — Telephone Encounter (Signed)
Called into [address], [address]

## 2015-11-26 NOTE — Telephone Encounter (Signed)
Please let him know that his x-ray came back normal, so we'll move forward with the MRI. Would you please ask him if he think he'll need anything to calm him down before the MRI and does he have any metal in his body? Thanks!
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
## 2015-12-01 ENCOUNTER — Telehealth: Payer: Self-pay

## 2015-12-01 NOTE — Telephone Encounter (Signed)
Authorziation for patient's MRI was DENIED.  Medical Criteria not met.   Per [employer]  CLINICAL CRITERIA  The appropriateness of advanced imaging for suspected meniscal tear or injury depends upon the patients history and symptoms, physical exam findings, clinical course, and planned treatment.  Dr. Johnson is out of the office and will return 12/06/2015. Will ask if she wants to order CT or what the next step is in the case or if she wants to do a Peer to Peer.   Called and notified patient of denial and the next steps that can be taken. He said to have Dr. Johnson or Tiff call him when she returns because the swelling has gone down and that's the reason she wanted him to have the MRI.  

## 2015-12-06 NOTE — Telephone Encounter (Signed)
Patient says he is feeling better and is no longer swollen. But still gets "tight feeling" when he moves around a lot and when he first wakes up.   I told patient I would refer this information back to Dr. Laural Benes, and once she decides what to do I'll call him back.

## 2015-12-06 NOTE — Telephone Encounter (Signed)
Spoke with patient, swelling has gone down, still stiff in the morning and after being on it but not swelling.  There is no locking or popping in the knee.  Ok to hold off on MRI until visit on 01/09/16

## 2015-12-06 NOTE — Telephone Encounter (Signed)
Can you find out if he's feeling better, and if he is, we'll just hold on the MRI now, and if he's not, I'll do the peer to peer? Thanks!

## 2015-12-08 ENCOUNTER — Ambulatory Visit: Payer: BC Managed Care – PPO

## 2015-12-09 ENCOUNTER — Encounter: Payer: Self-pay | Admitting: Family Medicine

## 2015-12-09 ENCOUNTER — Ambulatory Visit (INDEPENDENT_AMBULATORY_CARE_PROVIDER_SITE_OTHER): Payer: BC Managed Care – PPO | Admitting: Family Medicine

## 2015-12-09 VITALS — BP 134/80 | HR 55 | Temp 98.2°F | Wt 214.0 lb

## 2015-12-09 DIAGNOSIS — R252 Cramp and spasm: Secondary | ICD-10-CM | POA: Diagnosis not present
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
## 2015-12-10 LAB — COMPREHENSIVE METABOLIC PANEL
A/G RATIO: 1.7 (ref 1.2–2.2)
ALBUMIN: 4.5 g/dL (ref 3.5–5.5)
ALT: 29 IU/L (ref 0–44)
AST: 19 IU/L (ref 0–40)
Alkaline Phosphatase: 76 IU/L (ref 39–117)
BUN/Creatinine Ratio: 17 (ref 9–20)
BUN: 18 mg/dL (ref 6–24)
Bilirubin Total: 0.4 mg/dL (ref 0.0–1.2)
CALCIUM: 9.6 mg/dL (ref 8.7–10.2)
CO2: 24 mmol/L (ref 18–29)
Chloride: 99 mmol/L (ref 96–106)
Creatinine, Ser: 1.04 mg/dL (ref 0.76–1.27)
GFR, EST AFRICAN AMERICAN: 93 mL/min/{1.73_m2} (ref >59)
GFR, EST NON AFRICAN AMERICAN: 80 mL/min/{1.73_m2} (ref >59)
GLUCOSE: 139 mg/dL — AB (ref 65–99)
Globulin, Total: 2.7 g/dL (ref 1.5–4.5)
Potassium: 4.1 mmol/L (ref 3.5–5.2)
Sodium: 140 mmol/L (ref 134–144)
TOTAL PROTEIN: 7.2 g/dL (ref 6.0–8.5)

## 2015-12-14 ENCOUNTER — Encounter: Payer: Self-pay | Admitting: Family Medicine

## 2015-12-22 ENCOUNTER — Encounter: Payer: Self-pay | Admitting: Family Medicine

## 2015-12-23 ENCOUNTER — Ambulatory Visit (INDEPENDENT_AMBULATORY_CARE_PROVIDER_SITE_OTHER): Payer: BC Managed Care – PPO | Admitting: Family Medicine

## 2015-12-23 ENCOUNTER — Encounter: Payer: Self-pay | Admitting: Family Medicine

## 2015-12-23 VITALS — BP 127/84 | HR 56 | Temp 98.5°F | Wt 213.0 lb

## 2015-12-23 DIAGNOSIS — G629 Polyneuropathy, unspecified: Secondary | ICD-10-CM | POA: Diagnosis not present

## 2015-12-23 MED ORDER — GABAPENTIN 300 MG PO CAPS: 300.0000 mg | ORAL_CAPSULE | Freq: Three times a day (TID) | ORAL | 3 refills | Status: DC

## 2015-12-23 NOTE — Patient Instructions (Signed)
Follow up as needed
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
## 2015-12-23 NOTE — Telephone Encounter (Signed)
Shawn Shaw needs to get in for paperwork. Can we see if we can get him in with me today or Shawn Shaw either today or tomorrow?

## 2016-01-06 ENCOUNTER — Encounter: Payer: Self-pay | Admitting: Family Medicine

## 2016-01-06 ENCOUNTER — Other Ambulatory Visit: Payer: Self-pay

## 2016-01-06 ENCOUNTER — Ambulatory Visit (INDEPENDENT_AMBULATORY_CARE_PROVIDER_SITE_OTHER): Payer: BC Managed Care – PPO | Admitting: Family Medicine

## 2016-01-06 VITALS — BP 129/80 | Temp 97.9°F | Ht 70.0 in | Wt 219.0 lb

## 2016-01-06 DIAGNOSIS — E138 Other specified diabetes mellitus with unspecified complications: Secondary | ICD-10-CM | POA: Diagnosis not present

## 2016-01-06 DIAGNOSIS — N401 Enlarged prostate with lower urinary tract symptoms: Secondary | ICD-10-CM | POA: Diagnosis not present

## 2016-01-06 DIAGNOSIS — E785 Hyperlipidemia, unspecified: Secondary | ICD-10-CM

## 2016-01-06 DIAGNOSIS — E119 Type 2 diabetes mellitus without complications: Secondary | ICD-10-CM | POA: Diagnosis not present

## 2016-01-06 DIAGNOSIS — N138 Other obstructive and reflux uropathy: Secondary | ICD-10-CM

## 2016-01-06 LAB — BAYER DCA HB A1C WAIVED: HB A1C (BAYER DCA - WAIVED): 6.2 % (ref <7.0)

## 2016-01-06 LAB — LP+ALT+AST PICCOLO, WAIVED
ALT (SGPT) Piccolo, Waived: 37 U/L (ref 10–47)
AST (SGOT) Piccolo, Waived: 25 U/L (ref 11–38)
CHOL/HDL RATIO PICCOLO,WAIVE: 3.3 mg/dL
Cholesterol Piccolo, Waived: 108 mg/dL (ref <200)
HDL CHOL PICCOLO, WAIVED: 32 mg/dL — AB (ref >59)
LDL CHOL CALC PICCOLO WAIVED: 41 mg/dL (ref <100)
Triglycerides Piccolo,Waived: 173 mg/dL — ABNORMAL HIGH (ref <150)
VLDL CHOL CALC PICCOLO,WAIVE: 35 mg/dL — AB (ref <30)

## 2016-01-06 LAB — HEMOGLOBIN A1C: HEMOGLOBIN A1C: 6.2

## 2016-01-06 NOTE — Assessment & Plan Note (Signed)
Diabetes with good control today A1c is 6.2 Will continue current medications
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
## 2016-04-13 ENCOUNTER — Ambulatory Visit: Payer: BC Managed Care – PPO | Admitting: Family Medicine

## 2016-04-19 ENCOUNTER — Encounter: Payer: Self-pay | Admitting: Family Medicine

## 2016-04-19 ENCOUNTER — Ambulatory Visit (INDEPENDENT_AMBULATORY_CARE_PROVIDER_SITE_OTHER): Payer: BC Managed Care – PPO | Admitting: Family Medicine

## 2016-04-19 VITALS — BP 119/73 | HR 54 | Temp 97.9°F | Ht 71.5 in | Wt 228.6 lb

## 2016-04-19 DIAGNOSIS — Z23 Encounter for immunization: Secondary | ICD-10-CM

## 2016-04-19 DIAGNOSIS — G5793 Unspecified mononeuropathy of bilateral lower limbs: Secondary | ICD-10-CM

## 2016-04-19 DIAGNOSIS — I10 Essential (primary) hypertension: Secondary | ICD-10-CM

## 2016-04-19 DIAGNOSIS — G5790 Unspecified mononeuropathy of unspecified lower limb: Secondary | ICD-10-CM | POA: Insufficient documentation

## 2016-04-19 DIAGNOSIS — E119 Type 2 diabetes mellitus without complications: Secondary | ICD-10-CM

## 2016-04-19 LAB — BAYER DCA HB A1C WAIVED: HB A1C: 7 % — AB (ref <7.0)

## 2016-04-19 MED ORDER — PREGABALIN 75 MG PO CAPS: 75.0000 mg | ORAL_CAPSULE | Freq: Two times a day (BID) | ORAL | 3 refills | Status: DC
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
## 2016-04-19 NOTE — Assessment & Plan Note (Signed)
The current medical regimen is effective;  continue present plan and medications.  
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
## 2016-06-13 ENCOUNTER — Telehealth: Payer: Self-pay

## 2016-06-13 MED ORDER — DULAGLUTIDE 0.75 MG/0.5ML ~~LOC~~ SOAJ: 0.7500 mg | SUBCUTANEOUS | 12 refills | Status: DC

## 2016-06-13 NOTE — Telephone Encounter (Signed)
Rx changed to [employer]

## 2016-06-13 NOTE — Telephone Encounter (Signed)
Received a PA on patient for Tanzeum.  Notified that there is only a 28% chance this medication will be covered.   Insurance asked to be switched to either Trulicity or Victoza.  I can still attempt the PA if you would like.

## 2016-06-15 ENCOUNTER — Telehealth: Payer: Self-pay | Admitting: Family Medicine

## 2016-06-15 NOTE — Telephone Encounter (Signed)
Erroneous entry

## 2016-07-13 ENCOUNTER — Ambulatory Visit (INDEPENDENT_AMBULATORY_CARE_PROVIDER_SITE_OTHER): Payer: BC Managed Care – PPO | Admitting: Family Medicine

## 2016-07-13 ENCOUNTER — Encounter: Payer: Self-pay | Admitting: Family Medicine

## 2016-07-13 VITALS — BP 132/72 | HR 59 | Ht 72.0 in | Wt 222.0 lb

## 2016-07-13 DIAGNOSIS — Z0000 Encounter for general adult medical examination without abnormal findings: Secondary | ICD-10-CM

## 2016-07-13 DIAGNOSIS — F3342 Major depressive disorder, recurrent, in full remission: Secondary | ICD-10-CM

## 2016-07-13 DIAGNOSIS — E785 Hyperlipidemia, unspecified: Secondary | ICD-10-CM | POA: Diagnosis not present

## 2016-07-13 DIAGNOSIS — Z1329 Encounter for screening for other suspected endocrine disorder: Secondary | ICD-10-CM

## 2016-07-13 DIAGNOSIS — Z125 Encounter for screening for malignant neoplasm of prostate: Secondary | ICD-10-CM

## 2016-07-13 DIAGNOSIS — E119 Type 2 diabetes mellitus without complications: Secondary | ICD-10-CM

## 2016-07-13 DIAGNOSIS — I10 Essential (primary) hypertension: Secondary | ICD-10-CM | POA: Diagnosis not present

## 2016-07-13 MED ORDER — AMLODIPINE BESYLATE 10 MG PO TABS: 10.0000 mg | ORAL_TABLET | Freq: Every day | ORAL | 4 refills | Status: DC

## 2016-07-13 MED ORDER — METFORMIN HCL ER (MOD) 500 MG PO TB24: 500.0000 mg | ORAL_TABLET | Freq: Two times a day (BID) | ORAL | 4 refills | Status: DC

## 2016-07-13 MED ORDER — PREGABALIN 75 MG PO CAPS: 75.0000 mg | ORAL_CAPSULE | Freq: Two times a day (BID) | ORAL | 4 refills | Status: DC

## 2016-07-13 MED ORDER — DULAGLUTIDE 0.75 MG/0.5ML ~~LOC~~ SOAJ: 0.7500 mg | SUBCUTANEOUS | 4 refills | Status: DC

## 2016-07-13 MED ORDER — ATORVASTATIN CALCIUM 20 MG PO TABS: 20.0000 mg | ORAL_TABLET | Freq: Every day | ORAL | 4 refills | Status: DC

## 2016-07-13 MED ORDER — LOSARTAN POTASSIUM-HCTZ 100-12.5 MG PO TABS
1.0000 | ORAL_TABLET | Freq: Every day | ORAL | 4 refills | Status: DC
Start: 2016-07-13 — End: 2017-08-23

## 2016-07-13 NOTE — Assessment & Plan Note (Signed)
The current medical regimen is effective;  continue present plan and medications.  

## 2016-07-13 NOTE — Progress Notes (Signed)
BP 132/72 (BP Location: Left Arm)   Pulse (!) 59   Ht 6' (1.829 m)   Wt 222 lb (100.7 kg)   SpO2 98%   BMI 30.11 kg/m    Subjective:    Patient ID: Shawn Shaw, male    DOB: [birth year], 56 y.o.   MRN: [id number]  HPI: Shawn Shaw is a 56 y.o. male  Chief Complaint  Patient presents with  . Annual Exam    NO PSA. PT AS UROLOGLY APPT AT [facility].     Relevant past medical, surgical, family and social history reviewed and updated as indicated. Interim medical history since our last visit reviewed. Allergies and medications reviewed and updated.  Review of Systems  Constitutional: Negative.   HENT: Negative.   Eyes: Negative.   Respiratory: Negative.   Cardiovascular: Negative.   Gastrointestinal: Negative.   Endocrine: Negative.   Genitourinary: Negative.   Musculoskeletal: Negative.   Skin: Negative.   Allergic/Immunologic: Negative.   Neurological: Negative.   Hematological: Negative.   Psychiatric/Behavioral: Negative.     Per HPI unless specifically indicated above     Objective:    BP 132/72 (BP Location: Left Arm)   Pulse (!) 59   Ht 6' (1.829 m)   Wt 222 lb (100.7 kg)   SpO2 98%   BMI 30.11 kg/m   Wt Readings from Last 3 Encounters:  07/13/16 222 lb (100.7 kg)  04/19/16 228 lb 9.6 oz (103.7 kg)  01/06/16 219 lb (99.3 kg)    Physical Exam  Constitutional: He is oriented to person, place, and time. He appears well-developed and well-nourished.  HENT:  Head: Normocephalic.  Right Ear: External ear normal.  Left Ear: External ear normal.  Nose: Nose normal.  Eyes: Conjunctivae and EOM are normal. Pupils are equal, round, and reactive to light.  Neck: Normal range of motion. Neck supple. No thyromegaly present.  Cardiovascular: Normal rate, regular rhythm, normal heart sounds and intact distal pulses.   Pulmonary/Chest: Effort normal and breath sounds normal.  Abdominal: Soft. Bowel sounds are normal. There is no splenomegaly or hepatomegaly.   Genitourinary:  Genitourinary Comments: Done at urology  Musculoskeletal: Normal range of motion.  Lymphadenopathy:    He has no cervical adenopathy.  Neurological: He is alert and oriented to person, place, and time. He has normal reflexes.  Skin: Skin is warm and dry.  Psychiatric: He has a normal mood and affect. His behavior is normal. Judgment and thought content normal.    Results for orders placed or performed in visit on 04/19/16  Bayer DCA Hb A1c Waived  Result Value Ref Range   Bayer DCA Hb A1c Waived 7.0 (H) <7.0 %      Assessment & Plan:   Problem List Items Addressed This Visit      Cardiovascular and Mediastinum   Hypertension    The current medical regimen is effective;  continue present plan and medications.       Relevant Medications   losartan-hydrochlorothiazide (HYZAAR) 100-12.5 MG tablet   atorvastatin (LIPITOR) 20 MG tablet   amLODipine (NORVASC) 10 MG tablet   Other Relevant Orders   CBC with Differential/Platelet   Comprehensive metabolic panel   Urinalysis, Routine w reflex microscopic   Bayer DCA Hb A1c Waived     Endocrine   Diabetes mellitus without complication (HCC)    The current medical regimen is effective;  continue present plan and medications.       Relevant Medications   losartan-hydrochlorothiazide (HYZAAR)  100-12.5 MG tablet   Dulaglutide (TRULICITY) 0.75 MG/0.5ML SOPN   atorvastatin (LIPITOR) 20 MG tablet   metFORMIN (GLUMETZA) 500 MG (MOD) 24 hr tablet   Other Relevant Orders   CBC with Differential/Platelet   Comprehensive metabolic panel   Urinalysis, Routine w reflex microscopic   Bayer DCA Hb A1c Waived     Other   Depression    The current medical regimen is effective;  continue present plan and medications.       Hyperlipidemia    The current medical regimen is effective;  continue present plan and medications.        Relevant Medications   losartan-hydrochlorothiazide (HYZAAR) 100-12.5 MG tablet    atorvastatin (LIPITOR) 20 MG tablet   amLODipine (NORVASC) 10 MG tablet   Other Relevant Orders   CBC with Differential/Platelet   Comprehensive metabolic panel   Lipid panel   Urinalysis, Routine w reflex microscopic   Bayer DCA Hb A1c Waived    Other Visit Diagnoses    Annual physical exam    -  Primary   Relevant Orders   CBC with Differential/Platelet   Comprehensive metabolic panel   Lipid panel   TSH   Urinalysis, Routine w reflex microscopic   Bayer DCA Hb A1c Waived   Thyroid disorder screen       Relevant Orders   TSH   Prostate cancer screening           Follow up plan: Return in about 3 months (around 10/13/2016) for Hemoglobin A1c.

## 2016-07-14 ENCOUNTER — Encounter: Payer: Self-pay | Admitting: Family Medicine

## 2016-07-14 LAB — CBC WITH DIFFERENTIAL/PLATELET
BASOS ABS: 0 10*3/uL (ref 0.0–0.2)
Basos: 0 %
EOS (ABSOLUTE): 0.2 10*3/uL (ref 0.0–0.4)
EOS: 2 %
HEMOGLOBIN: 15.1 g/dL (ref 13.0–17.7)
Hematocrit: 42.8 % (ref 37.5–51.0)
IMMATURE GRANS (ABS): 0 10*3/uL (ref 0.0–0.1)
Immature Granulocytes: 0 %
Lymphocytes Absolute: 2.2 10*3/uL (ref 0.7–3.1)
Lymphs: 28 %
MCH: 30.1 pg (ref 26.6–33.0)
MCHC: 35.3 g/dL (ref 31.5–35.7)
MCV: 85 fL (ref 79–97)
MONOCYTES: 8 %
Monocytes Absolute: 0.7 10*3/uL (ref 0.1–0.9)
NEUTROS ABS: 5 10*3/uL (ref 1.4–7.0)
Neutrophils: 62 %
Platelets: 358 10*3/uL (ref 150–379)
RBC: 5.02 x10E6/uL (ref 4.14–5.80)
RDW: 14.3 % (ref 12.3–15.4)
WBC: 8 10*3/uL (ref 3.4–10.8)

## 2016-07-14 LAB — COMPREHENSIVE METABOLIC PANEL
ALBUMIN: 4.7 g/dL (ref 3.5–5.5)
ALT: 35 IU/L (ref 0–44)
AST: 21 IU/L (ref 0–40)
Albumin/Globulin Ratio: 1.7 (ref 1.2–2.2)
Alkaline Phosphatase: 84 IU/L (ref 39–117)
BUN / CREAT RATIO: 12 (ref 9–20)
BUN: 13 mg/dL (ref 6–24)
Bilirubin Total: 0.6 mg/dL (ref 0.0–1.2)
CALCIUM: 10.2 mg/dL (ref 8.7–10.2)
CO2: 26 mmol/L (ref 18–29)
CREATININE: 1.1 mg/dL (ref 0.76–1.27)
Chloride: 96 mmol/L (ref 96–106)
GFR calc Af Amer: 86 mL/min/{1.73_m2} (ref >59)
GFR, EST NON AFRICAN AMERICAN: 75 mL/min/{1.73_m2} (ref >59)
GLUCOSE: 87 mg/dL (ref 65–99)
Globulin, Total: 2.8 g/dL (ref 1.5–4.5)
Potassium: 4.1 mmol/L (ref 3.5–5.2)
SODIUM: 139 mmol/L (ref 134–144)
TOTAL PROTEIN: 7.5 g/dL (ref 6.0–8.5)

## 2016-07-14 LAB — BAYER DCA HB A1C WAIVED: HB A1C: 6.4 % (ref <7.0)

## 2016-07-14 LAB — URINALYSIS, ROUTINE W REFLEX MICROSCOPIC
BILIRUBIN UA: NEGATIVE
GLUCOSE, UA: NEGATIVE
Ketones, UA: NEGATIVE
LEUKOCYTES UA: NEGATIVE
Nitrite, UA: NEGATIVE
PROTEIN UA: NEGATIVE
RBC, UA: NEGATIVE
Specific Gravity, UA: 1.015 (ref 1.005–1.030)
Urobilinogen, Ur: 0.2 mg/dL (ref 0.2–1.0)
pH, UA: 5.5 (ref 5.0–7.5)

## 2016-07-14 LAB — LIPID PANEL
CHOL/HDL RATIO: 3.2 ratio (ref 0.0–5.0)
Cholesterol, Total: 105 mg/dL (ref 100–199)
HDL: 33 mg/dL — ABNORMAL LOW (ref >39)
LDL CALC: 39 mg/dL (ref 0–99)
Triglycerides: 165 mg/dL — ABNORMAL HIGH (ref 0–149)
VLDL CHOLESTEROL CAL: 33 mg/dL (ref 5–40)

## 2016-07-14 LAB — TSH: TSH: 1.25 u[IU]/mL (ref 0.450–4.500)

## 2016-09-22 ENCOUNTER — Other Ambulatory Visit: Payer: Self-pay | Admitting: Family Medicine

## 2016-09-22 NOTE — Telephone Encounter (Signed)
Last routine OV: 07/13/16 Next OV: 10/19/16
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
## 2016-10-19 ENCOUNTER — Encounter: Payer: Self-pay | Admitting: Family Medicine

## 2016-10-19 ENCOUNTER — Ambulatory Visit (INDEPENDENT_AMBULATORY_CARE_PROVIDER_SITE_OTHER): Payer: BC Managed Care – PPO | Admitting: Family Medicine

## 2016-10-19 VITALS — BP 130/80 | HR 55 | Ht 71.0 in | Wt 224.2 lb

## 2016-10-19 DIAGNOSIS — I10 Essential (primary) hypertension: Secondary | ICD-10-CM

## 2016-10-19 DIAGNOSIS — E119 Type 2 diabetes mellitus without complications: Secondary | ICD-10-CM

## 2016-10-19 DIAGNOSIS — E785 Hyperlipidemia, unspecified: Secondary | ICD-10-CM | POA: Diagnosis not present

## 2016-10-19 DIAGNOSIS — G5793 Unspecified mononeuropathy of bilateral lower limbs: Secondary | ICD-10-CM | POA: Diagnosis not present

## 2016-10-19 LAB — BAYER DCA HB A1C WAIVED: HB A1C: 6 % (ref <7.0)

## 2016-10-19 MED ORDER — PREGABALIN 75 MG PO CAPS: 75.0000 mg | ORAL_CAPSULE | Freq: Two times a day (BID) | ORAL | 5 refills | Status: DC

## 2016-10-19 NOTE — Progress Notes (Signed)
BP 130/80   Pulse (!) 55   Ht 5\' 11"  (1.803 m)   Wt 224 lb 3.2 oz (101.7 kg)   SpO2 96%   BMI 31.27 kg/m    Subjective:    Patient ID: Shawn Shaw, male    DOB: [birth year], 56 y.o.   MRN: [id number]  HPI: Shawn Shaw is a 56 y.o. male  Chief Complaint  Patient presents with  . Follow-up   Patient follow-up diabetes doing well taking Trulicity injections and doing very well with just some stinging side effects and otherwise blood sugars been doing very well last A1c was 7 and today A1c is 6.0. Blood pressure also doing well Reviewed peripheral neuropathy and Lyrica use which is adequate Relevant past medical, surgical, family and social history reviewed and updated as indicated. Interim medical history since our last visit reviewed. Allergies and medications reviewed and updated.  Review of Systems  Constitutional: Negative.   Respiratory: Negative.   Cardiovascular: Negative.     Per HPI unless specifically indicated above     Objective:    BP 130/80   Pulse (!) 55   Ht 5\' 11"  (1.803 m)   Wt 224 lb 3.2 oz (101.7 kg)   SpO2 96%   BMI 31.27 kg/m   Wt Readings from Last 3 Encounters:  10/19/16 224 lb 3.2 oz (101.7 kg)  07/13/16 222 lb (100.7 kg)  04/19/16 228 lb 9.6 oz (103.7 kg)    Physical Exam  Constitutional: He is oriented to person, place, and time. He appears well-developed and well-nourished.  HENT:  Head: Normocephalic and atraumatic.  Eyes: Conjunctivae and EOM are normal.  Neck: Normal range of motion.  Cardiovascular: Normal rate, regular rhythm and normal heart sounds.   Pulmonary/Chest: Effort normal and breath sounds normal.  Musculoskeletal: Normal range of motion.  Neurological: He is alert and oriented to person, place, and time.  Skin: No erythema.  Psychiatric: He has a normal mood and affect. His behavior is normal. Judgment and thought content normal.    Results for orders placed or performed in visit on 07/13/16  CBC with  Differential/Platelet  Result Value Ref Range   WBC 8.0 3.4 - 10.8 x10E3/uL   RBC 5.02 4.14 - 5.80 x10E6/uL   Hemoglobin 15.1 13.0 - 17.7 g/dL   Hematocrit 11.942.8 14.737.5 - 51.0 %   MCV 85 79 - 97 fL   MCH 30.1 26.6 - 33.0 pg   MCHC 35.3 31.5 - 35.7 g/dL   RDW 82.914.3 56.212.3 - 13.015.4 %   Platelets 358 150 - 379 x10E3/uL   Neutrophils 62 Not Estab. %   Lymphs 28 Not Estab. %   Monocytes 8 Not Estab. %   Eos 2 Not Estab. %   Basos 0 Not Estab. %   Neutrophils Absolute 5.0 1.4 - 7.0 x10E3/uL   Lymphocytes Absolute 2.2 0.7 - 3.1 x10E3/uL   Monocytes Absolute 0.7 0.1 - 0.9 x10E3/uL   EOS (ABSOLUTE) 0.2 0.0 - 0.4 x10E3/uL   Basophils Absolute 0.0 0.0 - 0.2 x10E3/uL   Immature Granulocytes 0 Not Estab. %   Immature Grans (Abs) 0.0 0.0 - 0.1 x10E3/uL  Comprehensive metabolic panel  Result Value Ref Range   Glucose 87 65 - 99 mg/dL   BUN 13 6 - 24 mg/dL   Creatinine, Ser 8.651.10 0.76 - 1.27 mg/dL   GFR calc non Af Amer 75 >59 mL/min/1.73   GFR calc Af Amer 86 >59 mL/min/1.73   BUN/Creatinine Ratio 12 9 -  20   Sodium 139 134 - 144 mmol/L   Potassium 4.1 3.5 - 5.2 mmol/L   Chloride 96 96 - 106 mmol/L   CO2 26 18 - 29 mmol/L   Calcium 10.2 8.7 - 10.2 mg/dL   Total Protein 7.5 6.0 - 8.5 g/dL   Albumin 4.7 3.5 - 5.5 g/dL   Globulin, Total 2.8 1.5 - 4.5 g/dL   Albumin/Globulin Ratio 1.7 1.2 - 2.2   Bilirubin Total 0.6 0.0 - 1.2 mg/dL   Alkaline Phosphatase 84 39 - 117 IU/L   AST 21 0 - 40 IU/L   ALT 35 0 - 44 IU/L  Lipid panel  Result Value Ref Range   Cholesterol, Total 105 100 - 199 mg/dL   Triglycerides 161 (H) 0 - 149 mg/dL   HDL 33 (L) >09 mg/dL   VLDL Cholesterol Cal 33 5 - 40 mg/dL   LDL Calculated 39 0 - 99 mg/dL   Chol/HDL Ratio 3.2 0.0 - 5.0 ratio units  TSH  Result Value Ref Range   TSH 1.250 0.450 - 4.500 uIU/mL  Urinalysis, Routine w reflex microscopic  Result Value Ref Range   Specific Gravity, UA 1.015 1.005 - 1.030   pH, UA 5.5 5.0 - 7.5   Color, UA Yellow Yellow    Appearance Ur Clear Clear   Leukocytes, UA Negative Negative   Protein, UA Negative Negative/Trace   Glucose, UA Negative Negative   Ketones, UA Negative Negative   RBC, UA Negative Negative   Bilirubin, UA Negative Negative   Urobilinogen, Ur 0.2 0.2 - 1.0 mg/dL   Nitrite, UA Negative Negative  Bayer DCA Hb A1c Waived  Result Value Ref Range   Bayer DCA Hb A1c Waived 6.4 <7.0 %      Assessment & Plan:   Problem List Items Addressed This Visit      Cardiovascular and Mediastinum   Hypertension - Primary   Relevant Orders   Bayer DCA Hb A1c Waived     Endocrine   Diabetes mellitus without complication (HCC)   Relevant Orders   Bayer DCA Hb A1c Waived     Nervous and Auditory   Neuropathy, leg   Relevant Medications   pregabalin (LYRICA) 75 MG capsule     Other   Hyperlipidemia   Relevant Orders   Bayer DCA Hb A1c Waived       Follow up plan: Return in about 3 months (around 01/19/2017) for Hemoglobin A1c, BMP,  Lipids, ALT, AST.

## 2016-11-21 ENCOUNTER — Encounter: Payer: Self-pay | Admitting: Family Medicine

## 2016-11-22 NOTE — Telephone Encounter (Signed)
Phone call Discussed with patient may be having some type of reaction to Trulicity will change to Bydureon gave patient 2 weeks of samples given weekly shots will observe response.

## 2016-11-22 NOTE — Telephone Encounter (Signed)
Left message on machine for pt to return call to the office.  

## 2017-02-08 ENCOUNTER — Ambulatory Visit (INDEPENDENT_AMBULATORY_CARE_PROVIDER_SITE_OTHER): Payer: BC Managed Care – PPO | Admitting: Family Medicine

## 2017-02-08 ENCOUNTER — Encounter: Payer: Self-pay | Admitting: Family Medicine

## 2017-02-08 ENCOUNTER — Other Ambulatory Visit: Payer: Self-pay | Admitting: Family Medicine

## 2017-02-08 VITALS — BP 128/70 | HR 58 | Wt 226.0 lb

## 2017-02-08 DIAGNOSIS — E785 Hyperlipidemia, unspecified: Secondary | ICD-10-CM

## 2017-02-08 DIAGNOSIS — I10 Essential (primary) hypertension: Secondary | ICD-10-CM

## 2017-02-08 DIAGNOSIS — E119 Type 2 diabetes mellitus without complications: Secondary | ICD-10-CM

## 2017-02-08 LAB — BAYER DCA HB A1C WAIVED: HB A1C (BAYER DCA - WAIVED): 6.1 % (ref <7.0)

## 2017-02-08 NOTE — Progress Notes (Signed)
   BP 128/70 (BP Location: Left Arm)   Pulse (!) 58   Wt 226 lb (102.5 kg)   SpO2 96%   BMI 31.52 kg/m    Subjective:    Patient ID: Shawn Shaw, male    DOB: [birth year], 56 y.o.   MRN: [id number]  HPI: [name] is a 56 y.o. male  Chief Complaint  Patient presents with  . Follow-up  . Hypertension  . Hyperlipidemia  . Diabetes  Patient all in all doing well takes medication without problems. Stopped Trulicity not doing any shots now. Feels that blood sugars been doing well. Had one low blood sugar episode on metformin 500 twice a day. Otherwise is been doing well. Cholesterol doing okay with Lipitor Blood pressure also doing well. Neuropathy doing okay with Lyrica.   Relevant past medical, surgical, family and social history reviewed and updated as indicated. Interim medical history since our last visit reviewed. Allergies and medications reviewed and updated.  Review of Systems  Constitutional: Negative.   Respiratory: Negative.   Cardiovascular: Negative.     Per HPI unless specifically indicated above     Objective:    BP 128/70 (BP Location: Left Arm)   Pulse (!) 58   Wt 226 lb (102.5 kg)   SpO2 96%   BMI 31.52 kg/m   Wt Readings from Last 3 Encounters:  02/08/17 226 lb (102.5 kg)  10/19/16 224 lb 3.2 oz (101.7 kg)  07/13/16 222 lb (100.7 kg)    Physical Exam  Constitutional: He is oriented to person, place, and time. He appears well-developed and well-nourished.  HENT:  Head: Normocephalic and atraumatic.  Eyes: Conjunctivae and EOM are normal.  Neck: Normal range of motion.  Cardiovascular: Normal rate, regular rhythm and normal heart sounds.   Pulmonary/Chest: Effort normal and breath sounds normal.  Musculoskeletal: Normal range of motion.  Neurological: He is alert and oriented to person, place, and time.  Skin: No erythema.  Psychiatric: He has a normal mood and affect. His behavior is normal. Judgment and thought content normal.     Results for orders placed or performed in visit on 02/08/17  Bayer DCA Hb A1c Waived  Result Value Ref Range   Bayer DCA Hb A1c Waived 6.1 <7.0 %      Assessment & Plan:   Problem List Items Addressed This Visit      Cardiovascular and Mediastinum   Hypertension - Primary    The current medical regimen is effective;  continue present plan and medications.       Relevant Orders   Basic metabolic panel   Bayer DCA Hb W0J Waived (Completed)   LP+ALT+AST Piccolo, Waived     Endocrine   Diabetes mellitus without complication (HCC)    The current medical regimen is effective;  continue present plan and medications.       Relevant Orders   Basic metabolic panel   Bayer DCA Hb W1X Waived (Completed)   LP+ALT+AST Piccolo, Waived     Other   Hyperlipidemia    The current medical regimen is effective;  continue present plan and medications.       Relevant Orders   Basic metabolic panel   Bayer DCA Hb B1Y Waived (Completed)   LP+ALT+AST Piccolo, Waived   Lipid panel   ALT   AST       Follow up plan: Return in about 3 months (around 05/10/2017) for Hemoglobin A1c.

## 2017-02-08 NOTE — Assessment & Plan Note (Signed)
The current medical regimen is effective;  continue present plan and medications.  

## 2017-02-09 LAB — BASIC METABOLIC PANEL
BUN / CREAT RATIO: 12 (ref 9–20)
BUN: 18 mg/dL (ref 6–24)
CALCIUM: 9.4 mg/dL (ref 8.7–10.2)
CO2: 22 mmol/L (ref 20–29)
CREATININE: 1.49 mg/dL — AB (ref 0.76–1.27)
Chloride: 100 mmol/L (ref 96–106)
GFR calc non Af Amer: 51 mL/min/{1.73_m2} — ABNORMAL LOW (ref >59)
GFR, EST AFRICAN AMERICAN: 59 mL/min/{1.73_m2} — AB (ref >59)
Glucose: 112 mg/dL — ABNORMAL HIGH (ref 65–99)
Potassium: 3.9 mmol/L (ref 3.5–5.2)
Sodium: 137 mmol/L (ref 134–144)

## 2017-02-13 LAB — LIPID PANEL W/O CHOL/HDL RATIO

## 2017-02-14 LAB — BASIC METABOLIC PANEL
BUN / CREAT RATIO: 14 (ref 9–20)
BUN: 18 mg/dL (ref 6–24)
CO2: 19 mmol/L — ABNORMAL LOW (ref 20–29)
CREATININE: 1.32 mg/dL — AB (ref 0.76–1.27)
Calcium: 9.4 mg/dL (ref 8.7–10.2)
Chloride: 103 mmol/L (ref 96–106)
GFR, EST AFRICAN AMERICAN: 69 mL/min/{1.73_m2} (ref >59)
GFR, EST NON AFRICAN AMERICAN: 59 mL/min/{1.73_m2} — AB (ref >59)
Glucose: 112 mg/dL — ABNORMAL HIGH (ref 65–99)
Potassium: 4 mmol/L (ref 3.5–5.2)
SODIUM: 140 mmol/L (ref 134–144)

## 2017-02-14 LAB — SPECIMEN STATUS REPORT

## 2017-02-16 LAB — ALT

## 2017-02-16 LAB — AST

## 2017-02-18 LAB — ALT: ALT: 23 IU/L (ref 0–44)

## 2017-02-18 LAB — AST: AST: 20 IU/L (ref 0–40)

## 2017-04-13 LAB — HM DIABETES EYE EXAM

## 2017-05-17 ENCOUNTER — Ambulatory Visit: Payer: BC Managed Care – PPO | Admitting: Family Medicine

## 2017-05-24 ENCOUNTER — Encounter: Payer: Self-pay | Admitting: Family Medicine

## 2017-05-24 ENCOUNTER — Ambulatory Visit: Payer: BC Managed Care – PPO | Admitting: Family Medicine

## 2017-05-24 VITALS — BP 138/86 | HR 53 | Wt 228.0 lb

## 2017-05-24 DIAGNOSIS — E119 Type 2 diabetes mellitus without complications: Secondary | ICD-10-CM | POA: Diagnosis not present

## 2017-05-24 DIAGNOSIS — G5793 Unspecified mononeuropathy of bilateral lower limbs: Secondary | ICD-10-CM | POA: Diagnosis not present

## 2017-05-24 DIAGNOSIS — I10 Essential (primary) hypertension: Secondary | ICD-10-CM

## 2017-05-24 LAB — BAYER DCA HB A1C WAIVED: HB A1C: 8.2 % — AB (ref <7.0)

## 2017-05-24 MED ORDER — PREGABALIN 75 MG PO CAPS: 75.0000 mg | ORAL_CAPSULE | Freq: Two times a day (BID) | ORAL | 5 refills | Status: DC

## 2017-05-24 MED ORDER — DAPAGLIFLOZIN PROPANEDIOL 10 MG PO TABS: 10.0000 mg | ORAL_TABLET | Freq: Every day | ORAL | 2 refills | Status: DC

## 2017-05-24 NOTE — Progress Notes (Signed)
   BP 138/86 (BP Location: Left Arm)   Pulse (!) 53   Wt 228 lb (103.4 kg)   SpO2 97%   BMI 31.80 kg/m    Subjective:    Patient ID: Shawn SatoBobby Vernon, male    DOB: 1961-01-17, 57 y.o.   MRN: [id number]  HPI: Shawn SatoBobby Spagna is a 57 y.o. male  Chief Complaint  Patient presents with  . Follow-up  . Diabetes  patient follow-up diabetes hypertension has gained weight and been off his dietthis winter. Has recently decided to do better and blood sugars have come down noticeably from the 300s to the low 100s. Patient did rg Trulicity but developed allergic side effects and is unable to take. Has increase metformin from 500 twice a day but is having side effects of GI intolerance at increased dose.  Relevant past medical, surgical, family and social history reviewed and updated as indicated. Interim medical history since our last visit reviewed. Allergies and medications reviewed and updated.  Review of Systems  Constitutional: Negative.   Respiratory: Negative.   Cardiovascular: Negative.     Per HPI unless specifically indicated above     Objective:    BP 138/86 (BP Location: Left Arm)   Pulse (!) 53   Wt 228 lb (103.4 kg)   SpO2 97%   BMI 31.80 kg/m   Wt Readings from Last 3 Encounters:  05/24/17 228 lb (103.4 kg)  02/08/17 226 lb (102.5 kg)  10/19/16 224 lb 3.2 oz (101.7 kg)    Physical Exam  Constitutional: He is oriented to person, place, and time. He appears well-developed and well-nourished.  HENT:  Head: Normocephalic and atraumatic.  Eyes: Conjunctivae and EOM are normal.  Neck: Normal range of motion.  Cardiovascular: Normal rate, regular rhythm and normal heart sounds.  Pulmonary/Chest: Effort normal and breath sounds normal.  Musculoskeletal: Normal range of motion.  Neurological: He is alert and oriented to person, place, and time.  Skin: No erythema.  Psychiatric: He has a normal mood and affect. His behavior is normal. Judgment and thought content normal.     Results for orders placed or performed in visit on 04/16/17  HM DIABETES EYE EXAM  Result Value Ref Range   HM Diabetic Eye Exam No Retinopathy No Retinopathy      Assessment & Plan:   Problem List Items Addressed This Visit      Cardiovascular and Mediastinum   Hypertension    The current medical regimen is effective;  continue present plan and medications.         Endocrine   Diabetes mellitus without complication (HCC) - Primary    Discussed diabetes poor control medicationsa coupon and Rx for Farxiga Patient will be checking blood sugars and if not coming down we will notify us prior to next visit in 3 months for physical.      Relevant Medications   dapagliflozin propanediol (FARXIGA) 10 MG TABS tablet   Other Relevant Orders   Bayer DCA Hb A1c Waived     Nervous and Auditory   Neuropathy, leg   Relevant Medications   pregabalin (LYRICA) 75 MG capsule       Follow up plan: Return in about 3 months (around 08/22/2017) for Physical Exam, Hemoglobin A1c.

## 2017-05-24 NOTE — Assessment & Plan Note (Signed)
Discussed diabetes poor control medicationsa coupon and Rx for Farxiga Patient will be checking blood sugars and if not coming down we will notify us prior to next visit in 3 months for physical.

## 2017-05-24 NOTE — Assessment & Plan Note (Signed)
The current medical regimen is effective;  continue present plan and medications.  

## 2017-05-31 ENCOUNTER — Ambulatory Visit: Payer: BC Managed Care – PPO | Admitting: Family Medicine

## 2017-07-18 ENCOUNTER — Encounter: Payer: Self-pay | Admitting: Family Medicine

## 2017-08-23 ENCOUNTER — Encounter: Payer: Self-pay | Admitting: Family Medicine

## 2017-08-23 ENCOUNTER — Ambulatory Visit: Payer: BC Managed Care – PPO | Admitting: Family Medicine

## 2017-08-23 VITALS — BP 141/85 | HR 59 | Ht 70.0 in | Wt 223.0 lb

## 2017-08-23 DIAGNOSIS — I10 Essential (primary) hypertension: Secondary | ICD-10-CM

## 2017-08-23 DIAGNOSIS — E119 Type 2 diabetes mellitus without complications: Secondary | ICD-10-CM | POA: Diagnosis not present

## 2017-08-23 DIAGNOSIS — N138 Other obstructive and reflux uropathy: Secondary | ICD-10-CM

## 2017-08-23 DIAGNOSIS — E785 Hyperlipidemia, unspecified: Secondary | ICD-10-CM | POA: Diagnosis not present

## 2017-08-23 DIAGNOSIS — N401 Enlarged prostate with lower urinary tract symptoms: Secondary | ICD-10-CM

## 2017-08-23 DIAGNOSIS — G5793 Unspecified mononeuropathy of bilateral lower limbs: Secondary | ICD-10-CM | POA: Diagnosis not present

## 2017-08-23 DIAGNOSIS — E7849 Other hyperlipidemia: Secondary | ICD-10-CM | POA: Diagnosis not present

## 2017-08-23 DIAGNOSIS — Z1329 Encounter for screening for other suspected endocrine disorder: Secondary | ICD-10-CM

## 2017-08-23 LAB — BAYER DCA HB A1C WAIVED: HB A1C: 7 % — AB (ref <7.0)

## 2017-08-23 LAB — URINALYSIS, ROUTINE W REFLEX MICROSCOPIC
BILIRUBIN UA: NEGATIVE
Glucose, UA: NEGATIVE
Ketones, UA: NEGATIVE
Leukocytes, UA: NEGATIVE
NITRITE UA: NEGATIVE
PH UA: 5.5 (ref 5.0–7.5)
Protein, UA: NEGATIVE
RBC UA: NEGATIVE
SPEC GRAV UA: 1.015 (ref 1.005–1.030)
Urobilinogen, Ur: 0.2 mg/dL (ref 0.2–1.0)

## 2017-08-23 MED ORDER — LOSARTAN POTASSIUM-HCTZ 100-12.5 MG PO TABS: 1.0000 | ORAL_TABLET | Freq: Every day | ORAL | 4 refills | Status: DC

## 2017-08-23 MED ORDER — AMLODIPINE BESYLATE 10 MG PO TABS: 10.0000 mg | ORAL_TABLET | Freq: Every day | ORAL | 4 refills | Status: DC

## 2017-08-23 MED ORDER — ATORVASTATIN CALCIUM 20 MG PO TABS: 20.0000 mg | ORAL_TABLET | Freq: Every day | ORAL | 4 refills | Status: DC

## 2017-08-23 MED ORDER — PREGABALIN 75 MG PO CAPS: 75.0000 mg | ORAL_CAPSULE | Freq: Two times a day (BID) | ORAL | 1 refills | Status: DC

## 2017-08-23 MED ORDER — METFORMIN HCL ER (MOD) 500 MG PO TB24: 500.0000 mg | ORAL_TABLET | Freq: Two times a day (BID) | ORAL | 4 refills | Status: DC

## 2017-08-23 NOTE — Progress Notes (Signed)
BP (!) 141/85   Pulse (!) 59   Ht 5\' 10"  (1.778 m)   Wt 223 lb (101.2 kg)   SpO2 98%   BMI 32.00 kg/m    Subjective:    Patient ID: Shawn Shaw, male    DOB: [birth year], 57 y.o.   MRN: [id number]  HPI: Shawn Shaw is a 57 y.o. male  Chief Complaint  Patient presents with  . Annual Exam  . Diabetes    Pt cannot take [place]. Pt said it made him itch.    Follow-up diabetes unable to take Farexiga due to tinea changes. Wondering about going back to Trulicity.  Had small rash around injection site and on further reflection patient was doing injection without any skin prep or alcohol wipe.  And rash was very trivial. State followed by urology has appointment coming up. Taking cholesterol no issues atorvastatin without problems. Blood pressure doing well on medications with no issues. Diabetes medicines otherwise doing well no low blood sugar spells. Patient has been able to lose 5 pounds by increased activity primarily through work.  Relevant past medical, surgical, family and social history reviewed and updated as indicated. Interim medical history since our last visit reviewed. Allergies and medications reviewed and updated.  Review of Systems  Constitutional: Negative.   HENT: Negative.   Eyes: Negative.   Respiratory: Negative.   Cardiovascular: Negative.   Gastrointestinal: Negative.   Endocrine: Negative.   Genitourinary: Negative.   Musculoskeletal: Negative.   Skin: Negative.   Allergic/Immunologic: Negative.   Neurological: Negative.   Hematological: Negative.   Psychiatric/Behavioral: Negative.     Per HPI unless specifically indicated above     Objective:    BP (!) 141/85   Pulse (!) 59   Ht 5\' 10"  (1.778 m)   Wt 223 lb (101.2 kg)   SpO2 98%   BMI 32.00 kg/m   Wt Readings from Last 3 Encounters:  08/23/17 223 lb (101.2 kg)  05/24/17 228 lb (103.4 kg)  02/08/17 226 lb (102.5 kg)    Physical Exam  Constitutional: He is oriented to person,  place, and time. He appears well-developed and well-nourished.  HENT:  Head: Normocephalic.  Right Ear: External ear normal.  Left Ear: External ear normal.  Nose: Nose normal.  Eyes: Pupils are equal, round, and reactive to light. Conjunctivae and EOM are normal.  Neck: Normal range of motion. Neck supple. No thyromegaly present.  Cardiovascular: Normal rate, regular rhythm, normal heart sounds and intact distal pulses.  Pulmonary/Chest: Effort normal and breath sounds normal.  Abdominal: Soft. Bowel sounds are normal. There is no splenomegaly or hepatomegaly.  Genitourinary:  Genitourinary Comments: Done at urology  Musculoskeletal: Normal range of motion.  Lymphadenopathy:    He has no cervical adenopathy.  Neurological: He is alert and oriented to person, place, and time. He has normal reflexes.  Skin: Skin is warm and dry.  Psychiatric: He has a normal mood and affect. His behavior is normal. Judgment and thought content normal.    Results for orders placed or performed in visit on 05/24/17  Bayer DCA Hb A1c Waived  Result Value Ref Range   Bayer DCA Hb A1c Waived 8.2 (H) <7.0 %      Assessment & Plan:   Problem List Items Addressed This Visit      Cardiovascular and Mediastinum   Hypertension - Primary   Relevant Medications   losartan-hydrochlorothiazide (HYZAAR) 100-12.5 MG tablet   atorvastatin (LIPITOR) 20 MG tablet   amLODipine (  NORVASC) 10 MG tablet   Other Relevant Orders   CBC with Differential/Platelet   Comprehensive metabolic panel   Lipid panel   Urinalysis, Routine w reflex microscopic   Bayer DCA Hb A1c Waived     Endocrine   Diabetes mellitus without complication (HCC)   Relevant Medications   metFORMIN (GLUMETZA) 500 MG (MOD) 24 hr tablet   losartan-hydrochlorothiazide (HYZAAR) 100-12.5 MG tablet   atorvastatin (LIPITOR) 20 MG tablet   Other Relevant Orders   CBC with Differential/Platelet   Comprehensive metabolic panel   Lipid panel    Urinalysis, Routine w reflex microscopic   Bayer DCA Hb A1c Waived     Nervous and Auditory   Neuropathy, leg   Relevant Medications   pregabalin (LYRICA) 75 MG capsule     Genitourinary   BPH with obstruction/lower urinary tract symptoms   Relevant Orders   PSA     Other   Hyperlipidemia   Relevant Medications   losartan-hydrochlorothiazide (HYZAAR) 100-12.5 MG tablet   atorvastatin (LIPITOR) 20 MG tablet   amLODipine (NORVASC) 10 MG tablet   Other Relevant Orders   CBC with Differential/Platelet   Comprehensive metabolic panel   Lipid panel   Urinalysis, Routine w reflex microscopic   Bayer DCA Hb A1c Waived    Other Visit Diagnoses    Thyroid disorder screen       Relevant Orders   TSH       Follow up plan: No follow-ups on file.

## 2017-08-24 ENCOUNTER — Encounter: Payer: Self-pay | Admitting: Family Medicine

## 2017-08-24 LAB — COMPREHENSIVE METABOLIC PANEL
A/G RATIO: 1.7 (ref 1.2–2.2)
ALK PHOS: 74 IU/L (ref 39–117)
ALT: 25 IU/L (ref 0–44)
AST: 17 IU/L (ref 0–40)
Albumin: 4.2 g/dL (ref 3.5–5.5)
BILIRUBIN TOTAL: 0.5 mg/dL (ref 0.0–1.2)
BUN/Creatinine Ratio: 15 (ref 9–20)
BUN: 17 mg/dL (ref 6–24)
CHLORIDE: 101 mmol/L (ref 96–106)
CO2: 23 mmol/L (ref 20–29)
Calcium: 9.7 mg/dL (ref 8.7–10.2)
Creatinine, Ser: 1.11 mg/dL (ref 0.76–1.27)
GFR calc Af Amer: 85 mL/min/{1.73_m2} (ref >59)
GFR calc non Af Amer: 73 mL/min/{1.73_m2} (ref >59)
GLOBULIN, TOTAL: 2.5 g/dL (ref 1.5–4.5)
Glucose: 136 mg/dL — ABNORMAL HIGH (ref 65–99)
POTASSIUM: 4 mmol/L (ref 3.5–5.2)
SODIUM: 137 mmol/L (ref 134–144)
Total Protein: 6.7 g/dL (ref 6.0–8.5)

## 2017-08-24 LAB — LIPID PANEL
Chol/HDL Ratio: 3.7 ratio (ref 0.0–5.0)
Cholesterol, Total: 118 mg/dL (ref 100–199)
HDL: 32 mg/dL — AB (ref >39)
LDL Calculated: 53 mg/dL (ref 0–99)
TRIGLYCERIDES: 167 mg/dL — AB (ref 0–149)
VLDL Cholesterol Cal: 33 mg/dL (ref 5–40)

## 2017-08-24 LAB — CBC WITH DIFFERENTIAL/PLATELET
Basophils Absolute: 0 10*3/uL (ref 0.0–0.2)
Basos: 1 %
EOS (ABSOLUTE): 0.2 10*3/uL (ref 0.0–0.4)
Eos: 3 %
Hematocrit: 41.3 % (ref 37.5–51.0)
Hemoglobin: 13.4 g/dL (ref 13.0–17.7)
IMMATURE GRANULOCYTES: 0 %
Immature Grans (Abs): 0 10*3/uL (ref 0.0–0.1)
LYMPHS ABS: 1.8 10*3/uL (ref 0.7–3.1)
Lymphs: 29 %
MCH: 28.8 pg (ref 26.6–33.0)
MCHC: 32.4 g/dL (ref 31.5–35.7)
MCV: 89 fL (ref 79–97)
MONOS ABS: 0.4 10*3/uL (ref 0.1–0.9)
Monocytes: 6 %
NEUTROS ABS: 4 10*3/uL (ref 1.4–7.0)
NEUTROS PCT: 61 %
PLATELETS: 313 10*3/uL (ref 150–379)
RBC: 4.66 x10E6/uL (ref 4.14–5.80)
RDW: 13.7 % (ref 12.3–15.4)
WBC: 6.4 10*3/uL (ref 3.4–10.8)

## 2017-08-24 LAB — TSH: TSH: 1.16 u[IU]/mL (ref 0.450–4.500)

## 2017-08-24 LAB — PSA: Prostate Specific Ag, Serum: 1.5 ng/mL (ref 0.0–4.0)

## 2017-11-22 ENCOUNTER — Encounter: Payer: Self-pay | Admitting: Family Medicine

## 2017-11-22 ENCOUNTER — Ambulatory Visit (INDEPENDENT_AMBULATORY_CARE_PROVIDER_SITE_OTHER): Payer: BC Managed Care – PPO | Admitting: Family Medicine

## 2017-11-22 VITALS — BP 123/72 | HR 57 | Wt 220.0 lb

## 2017-11-22 DIAGNOSIS — E785 Hyperlipidemia, unspecified: Secondary | ICD-10-CM

## 2017-11-22 DIAGNOSIS — E119 Type 2 diabetes mellitus without complications: Secondary | ICD-10-CM | POA: Diagnosis not present

## 2017-11-22 DIAGNOSIS — I10 Essential (primary) hypertension: Secondary | ICD-10-CM | POA: Diagnosis not present

## 2017-11-22 LAB — BAYER DCA HB A1C WAIVED: HB A1C: 7 % — AB (ref <7.0)

## 2017-11-22 NOTE — Assessment & Plan Note (Signed)
The current medical regimen is effective;  continue present plan and medications.  

## 2017-11-22 NOTE — Assessment & Plan Note (Signed)
Discussed diabetes with peripheral neuropathy hemoglobin A1c of 7 today indicating stable control patient will try to do better continue weight loss and try to get blood sugar better.

## 2017-11-22 NOTE — Progress Notes (Signed)
BP 123/72   Pulse (!) 57   Wt 220 lb (99.8 kg)   SpO2 98%   BMI 31.57 kg/m    Subjective:    Patient ID: Shawn Shaw, male    DOB: Feb 07, 1961, 57 y.o.   MRN: [id number]  HPI: Shawn Shaw is a 57 y.o. male  Chief Complaint  Patient presents with  . Follow-up   Patient all in all doing okay except concerned as blood sugars been up some had some readings around 200 took an extra metformin seem to may be help as next blood sugar checked was in the 130s. Other medications doing well for cholesterol blood pressure and DPN.  No complaints from medications.  Relevant past medical, surgical, family and social history reviewed and updated as indicated. Interim medical history since our last visit reviewed. Allergies and medications reviewed and updated.  Review of Systems  Constitutional: Negative.   Respiratory: Negative.   Cardiovascular: Negative.     Per HPI unless specifically indicated above     Objective:    BP 123/72   Pulse (!) 57   Wt 220 lb (99.8 kg)   SpO2 98%   BMI 31.57 kg/m   Wt Readings from Last 3 Encounters:  11/22/17 220 lb (99.8 kg)  08/23/17 223 lb (101.2 kg)  05/24/17 228 lb (103.4 kg)    Physical Exam  Constitutional: He is oriented to person, place, and time. He appears well-developed and well-nourished.  HENT:  Head: Normocephalic and atraumatic.  Eyes: Conjunctivae and EOM are normal.  Neck: Normal range of motion.  Cardiovascular: Normal rate, regular rhythm and normal heart sounds.  Pulmonary/Chest: Effort normal and breath sounds normal.  Musculoskeletal: Normal range of motion.  Neurological: He is alert and oriented to person, place, and time.  Skin: No erythema.  Psychiatric: He has a normal mood and affect. His behavior is normal. Judgment and thought content normal.    Results for orders placed or performed in visit on 08/23/17  CBC with Differential/Platelet  Result Value Ref Range   WBC 6.4 3.4 - 10.8 x10E3/uL   RBC 4.66  4.14 - 5.80 x10E6/uL   Hemoglobin 13.4 13.0 - 17.7 g/dL   Hematocrit 40.941.3 81.137.5 - 51.0 %   MCV 89 79 - 97 fL   MCH 28.8 26.6 - 33.0 pg   MCHC 32.4 31.5 - 35.7 g/dL   RDW 91.413.7 78.212.3 - 95.615.4 %   Platelets 313 150 - 379 x10E3/uL   Neutrophils 61 Not Estab. %   Lymphs 29 Not Estab. %   Monocytes 6 Not Estab. %   Eos 3 Not Estab. %   Basos 1 Not Estab. %   Neutrophils Absolute 4.0 1.4 - 7.0 x10E3/uL   Lymphocytes Absolute 1.8 0.7 - 3.1 x10E3/uL   Monocytes Absolute 0.4 0.1 - 0.9 x10E3/uL   EOS (ABSOLUTE) 0.2 0.0 - 0.4 x10E3/uL   Basophils Absolute 0.0 0.0 - 0.2 x10E3/uL   Immature Granulocytes 0 Not Estab. %   Immature Grans (Abs) 0.0 0.0 - 0.1 x10E3/uL  Comprehensive metabolic panel  Result Value Ref Range   Glucose 136 (H) 65 - 99 mg/dL   BUN 17 6 - 24 mg/dL   Creatinine, Ser 2.131.11 0.76 - 1.27 mg/dL   GFR calc non Af Amer 73 >59 mL/min/1.73   GFR calc Af Amer 85 >59 mL/min/1.73   BUN/Creatinine Ratio 15 9 - 20   Sodium 137 134 - 144 mmol/L   Potassium 4.0 3.5 - 5.2 mmol/L  Chloride 101 96 - 106 mmol/L   CO2 23 20 - 29 mmol/L   Calcium 9.7 8.7 - 10.2 mg/dL   Total Protein 6.7 6.0 - 8.5 g/dL   Albumin 4.2 3.5 - 5.5 g/dL   Globulin, Total 2.5 1.5 - 4.5 g/dL   Albumin/Globulin Ratio 1.7 1.2 - 2.2   Bilirubin Total 0.5 0.0 - 1.2 mg/dL   Alkaline Phosphatase 74 39 - 117 IU/L   AST 17 0 - 40 IU/L   ALT 25 0 - 44 IU/L  Lipid panel  Result Value Ref Range   Cholesterol, Total 118 100 - 199 mg/dL   Triglycerides 161 (H) 0 - 149 mg/dL   HDL 32 (L) >09 mg/dL   VLDL Cholesterol Cal 33 5 - 40 mg/dL   LDL Calculated 53 0 - 99 mg/dL   Chol/HDL Ratio 3.7 0.0 - 5.0 ratio  PSA  Result Value Ref Range   Prostate Specific Ag, Serum 1.5 0.0 - 4.0 ng/mL  TSH  Result Value Ref Range   TSH 1.160 0.450 - 4.500 uIU/mL  Urinalysis, Routine w reflex microscopic  Result Value Ref Range   Specific Gravity, UA 1.015 1.005 - 1.030   pH, UA 5.5 5.0 - 7.5   Color, UA Yellow Yellow   Appearance Ur  Clear Clear   Leukocytes, UA Negative Negative   Protein, UA Negative Negative/Trace   Glucose, UA Negative Negative   Ketones, UA Negative Negative   RBC, UA Negative Negative   Bilirubin, UA Negative Negative   Urobilinogen, Ur 0.2 0.2 - 1.0 mg/dL   Nitrite, UA Negative Negative  Bayer DCA Hb A1c Waived  Result Value Ref Range   HB A1C (BAYER DCA - WAIVED) 7.0 (H) <7.0 %      Assessment & Plan:   Problem List Items Addressed This Visit      Cardiovascular and Mediastinum   Hypertension - Primary    The current medical regimen is effective;  continue present plan and medications.       Relevant Orders   Bayer DCA Hb A1c Waived     Endocrine   Diabetes mellitus without complication (HCC)    Discussed diabetes with peripheral neuropathy hemoglobin A1c of 7 today indicating stable control patient will try to do better continue weight loss and try to get blood sugar better.      Relevant Orders   Bayer DCA Hb A1c Waived     Other   Hyperlipidemia    The current medical regimen is effective;  continue present plan and medications.           Follow up plan: Return in about 3 months (around 02/22/2018) for BMP,  Lipids, ALT, AST, Hemoglobin A1c.

## 2018-02-28 ENCOUNTER — Ambulatory Visit (INDEPENDENT_AMBULATORY_CARE_PROVIDER_SITE_OTHER): Payer: BC Managed Care – PPO | Admitting: Family Medicine

## 2018-02-28 ENCOUNTER — Encounter: Payer: Self-pay | Admitting: Family Medicine

## 2018-02-28 VITALS — BP 128/78 | HR 57 | Wt 222.0 lb

## 2018-02-28 DIAGNOSIS — E119 Type 2 diabetes mellitus without complications: Secondary | ICD-10-CM | POA: Diagnosis not present

## 2018-02-28 DIAGNOSIS — E785 Hyperlipidemia, unspecified: Secondary | ICD-10-CM | POA: Diagnosis not present

## 2018-02-28 DIAGNOSIS — Z23 Encounter for immunization: Secondary | ICD-10-CM

## 2018-02-28 DIAGNOSIS — I10 Essential (primary) hypertension: Secondary | ICD-10-CM | POA: Diagnosis not present

## 2018-02-28 DIAGNOSIS — G5793 Unspecified mononeuropathy of bilateral lower limbs: Secondary | ICD-10-CM

## 2018-02-28 MED ORDER — DULAGLUTIDE 0.75 MG/0.5ML ~~LOC~~ SOAJ: 0.7500 mg | SUBCUTANEOUS | 12 refills | Status: DC

## 2018-02-28 MED ORDER — TRIAMCINOLONE ACETONIDE 0.1 % EX CREA: 1.0000 "application " | TOPICAL_CREAM | Freq: Two times a day (BID) | CUTANEOUS | 0 refills | Status: DC

## 2018-02-28 MED ORDER — PREGABALIN 75 MG PO CAPS: 75.0000 mg | ORAL_CAPSULE | Freq: Two times a day (BID) | ORAL | 1 refills | Status: DC

## 2018-02-28 NOTE — Patient Instructions (Signed)

## 2018-02-28 NOTE — Assessment & Plan Note (Signed)
The current medical regimen is effective;  continue present plan and medications.  

## 2018-02-28 NOTE — Progress Notes (Signed)
BP 128/78 (BP Location: Left Arm)   Pulse (!) 57   Wt 222 lb (100.7 kg)   SpO2 95%   BMI 31.85 kg/m    Subjective:    Patient ID: Shawn Shaw, male    DOB: Sep 18, 1960, 57 y.o.   MRN: [id number]  HPI: Shawn Shaw is a 57 y.o. male  Med check  Patient for recheck diabetes hypertension hypercholesterol diabetic peripheral neuropathy. Blood pressure doing well with medications no complaints. No issues with Lipitor. Doing well with BPH issues. Metformin's concerns had some high readings. Diabetic peripheral neuropathy doing well with Lyrica. Was taking Trulicity without problems and was concerned may be causing some rash but really is feeling that was inadequate skin preparation and technique.  Relevant past medical, surgical, family and social history reviewed and updated as indicated. Interim medical history since our last visit reviewed. Allergies and medications reviewed and updated.  Review of Systems  Constitutional: Negative.   Respiratory: Negative.   Cardiovascular: Negative.     Per HPI unless specifically indicated above     Objective:    BP 128/78 (BP Location: Left Arm)   Pulse (!) 57   Wt 222 lb (100.7 kg)   SpO2 95%   BMI 31.85 kg/m   Wt Readings from Last 3 Encounters:  02/28/18 222 lb (100.7 kg)  11/22/17 220 lb (99.8 kg)  08/23/17 223 lb (101.2 kg)    Physical Exam  Constitutional: He is oriented to person, place, and time. He appears well-developed and well-nourished.  HENT:  Head: Normocephalic and atraumatic.  Eyes: Conjunctivae and EOM are normal.  Neck: Normal range of motion.  Cardiovascular: Normal rate, regular rhythm and normal heart sounds.  Pulmonary/Chest: Effort normal and breath sounds normal.  Musculoskeletal: Normal range of motion.  Neurological: He is alert and oriented to person, place, and time.  Skin: No erythema.  Psychiatric: He has a normal mood and affect. His behavior is normal. Judgment and thought content  normal.    Results for orders placed or performed in visit on 11/22/17  Bayer DCA Hb A1c Waived  Result Value Ref Range   HB A1C (BAYER DCA - WAIVED) 7.0 (H) <7.0 %      Assessment & Plan:   Problem List Items Addressed This Visit      Cardiovascular and Mediastinum   Hypertension - Primary    The current medical regimen is effective;  continue present plan and medications. a      Relevant Orders   LP+ALT+AST Piccolo, Waived   Basic metabolic panel   Bayer DCA Hb N8G Waived     Endocrine   Diabetes mellitus without complication (HCC)    poor control will restart Trulicity      Relevant Medications   Dulaglutide (TRULICITY) 0.75 MG/0.5ML SOPN   Other Relevant Orders   LP+ALT+AST Piccolo, Waived   Basic metabolic panel   Bayer DCA Hb N5A Waived     Nervous and Auditory   Neuropathy, leg    The current medical regimen is effective;  continue present plan and medications.       Relevant Medications   pregabalin (LYRICA) 75 MG capsule     Other   Hyperlipidemia    The current medical regimen is effective;  continue present plan and medications.       Relevant Orders   LP+ALT+AST Piccolo, Waived   Basic metabolic panel   Bayer DCA Hb O1H Waived    Other Visit Diagnoses    Needs  flu shot       Relevant Orders   Flu Vaccine QUAD 6+ mos PF IM (Fluarix Quad PF) (Completed)       Follow up plan: Return in about 6 months (around 08/30/2018) for Physical Exam, Hemoglobin A1c.

## 2018-02-28 NOTE — Assessment & Plan Note (Signed)
The current medical regimen is effective;  continue present plan and medications. a 

## 2018-02-28 NOTE — Assessment & Plan Note (Signed)
poor control will restart Trulicity

## 2018-03-01 LAB — LP+ALT+AST PICCOLO, WAIVED
ALT (SGPT) PICCOLO, WAIVED: 39 U/L (ref 10–47)
AST (SGOT) PICCOLO, WAIVED: 31 U/L (ref 11–38)
Chol/HDL Ratio Piccolo,Waive: 3.9 mg/dL
Cholesterol Piccolo, Waived: 124 mg/dL (ref <200)
HDL CHOL PICCOLO, WAIVED: 32 mg/dL — AB (ref >59)
LDL Chol Calc Piccolo Waived: 37 mg/dL (ref <100)
TRIGLYCERIDES PICCOLO,WAIVED: 278 mg/dL — AB (ref <150)
VLDL CHOL CALC PICCOLO,WAIVE: 56 mg/dL — AB (ref <30)

## 2018-03-01 LAB — BAYER DCA HB A1C WAIVED: HB A1C: 7.8 % — AB (ref <7.0)

## 2018-03-01 LAB — BASIC METABOLIC PANEL
BUN / CREAT RATIO: 15 (ref 9–20)
BUN: 16 mg/dL (ref 6–24)
CHLORIDE: 99 mmol/L (ref 96–106)
CO2: 23 mmol/L (ref 20–29)
Calcium: 10 mg/dL (ref 8.7–10.2)
Creatinine, Ser: 1.08 mg/dL (ref 0.76–1.27)
GFR calc Af Amer: 87 mL/min/{1.73_m2} (ref >59)
GFR calc non Af Amer: 75 mL/min/{1.73_m2} (ref >59)
GLUCOSE: 175 mg/dL — AB (ref 65–99)
POTASSIUM: 4.3 mmol/L (ref 3.5–5.2)
SODIUM: 138 mmol/L (ref 134–144)

## 2018-03-02 ENCOUNTER — Encounter: Payer: Self-pay | Admitting: Family Medicine

## 2018-03-12 ENCOUNTER — Other Ambulatory Visit: Payer: Self-pay

## 2018-03-12 ENCOUNTER — Ambulatory Visit
Admission: EM | Admit: 2018-03-12 | Discharge: 2018-03-12 | Disposition: A | Discharge status: Home / Self Care | Payer: BC Managed Care – PPO | Attending: Family Medicine | Admitting: Family Medicine

## 2018-03-12 ENCOUNTER — Encounter: Payer: Self-pay | Admitting: Emergency Medicine

## 2018-03-12 DIAGNOSIS — L509 Urticaria, unspecified: Secondary | ICD-10-CM

## 2018-03-12 MED ORDER — HYDROXYZINE HCL 25 MG PO TABS: 25.0000 mg | ORAL_TABLET | Freq: Three times a day (TID) | ORAL | 0 refills | Status: DC | PRN

## 2018-03-12 MED ORDER — METHYLPREDNISOLONE SODIUM SUCC 40 MG IJ SOLR
80.0000 mg | Freq: Once | INTRAMUSCULAR | Status: AC
  Administered 2018-03-12: 80 mg via INTRAMUSCULAR

## 2018-03-12 NOTE — Discharge Instructions (Signed)
Stop Trulicity.  Atarax as prescribed.  Follow up with your PCP.  Take care  Dr. Adriana Simas

## 2018-03-12 NOTE — ED Triage Notes (Signed)
Patient c/o allergic reaction that started after taking his Trulicity injection. Patient started Trulicity 2 weeks ago. Last injection was last Thursday.

## 2018-03-12 NOTE — ED Provider Notes (Signed)
[facility]    CSN: [id number] Arrival date & time: 03/12/18  0901  History   Chief Complaint Chief Complaint  Patient presents with  . Allergic Reaction   HPI  57 year old male presents with diffuse rash.  Patient is recently started a new medication, Trulicity.  Patient recently gave himself an injection.  He subsequently developed redness at the injection site and now has developed diffuse, itchy rash.  Rash is raised.  Severe. Corncern for hives.  No known relieving factors. Unsure if related to Trulicity.  No reports of shortness of breath or lip or tongue swelling.  No other associated symptoms.  No other complaints.  PMH, Surgical Hx, Family Hx, Social History reviewed and updated as below.  Past Medical History:  Diagnosis Date  . Anxiety   . Depression   . Diabetes mellitus without complication (HCC)   . Hyperlipidemia   . Hypertension    Patient Active Problem List   Diagnosis Date Noted  . Neuropathy, leg 04/19/2016  . 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 Diabetes mellitus without complication (HCC)   . Depression   . Anxiety   . Hyperlipidemia   . Hypertension    Past Surgical History:  Procedure Laterality Date  . PTERYGIUM EXCISION Right    Home Medications    Prior to Admission medications   Medication Sig Start Date End Date Taking? Authorizing Provider  amLODipine (NORVASC) 10 MG tablet Take 1 tablet (10 mg total) by mouth daily. 08/23/17  Yes Crissman, Redge Gainer, MD  atorvastatin (LIPITOR) 20 MG tablet Take 1 tablet (20 mg total) by mouth daily. 08/23/17  Yes Crissman, Redge Gainer, MD  finasteride (PROSCAR) 5 MG tablet 5 mg daily. 07/27/15  Yes [provider]  losartan-hydrochlorothiazide (HYZAAR) 100-12.5 MG tablet Take 1 tablet by mouth daily. 08/23/17  Yes Crissman, Redge Gainer, MD  metFORMIN (GLUMETZA) 500 MG (MOD) 24 hr tablet Take 1 tablet (500 mg total) by mouth 2 (two) times daily with a meal. 08/23/17  Yes  Crissman, Redge Gainer, MD  pregabalin (LYRICA) 75 MG capsule Take 1 capsule (75 mg total) by mouth 2 (two) times daily. 02/28/18  Yes Crissman, Redge Gainer, MD  triamcinolone cream (KENALOG) 0.1 % Apply 1 application topically 2 (two) times daily. 02/28/18  Yes Crissman, Redge Gainer, MD  hydrOXYzine (ATARAX/VISTARIL) 25 MG tablet Take 1 tablet (25 mg total) by mouth every 8 (eight) hours as needed. 03/12/18   Tommie Sams, DO    Family History Family History  Family history unknown: Yes    Social History Social History   Tobacco Use  . Smoking status: Former Smoker    Types: Cigarettes    Last attempt to quit: 12/22/2008    Years since quitting: 9.2  . Smokeless tobacco: Never Used  Substance Use Topics  . Alcohol use: Not Currently    Alcohol/week: 0.0 standard drinks  . Drug use: No     Allergies   Lisinopril   Review of Systems Review of Systems  Respiratory: Negative.   Skin: Positive for rash.   Physical Exam Triage Vital Signs ED Triage Vitals [03/12/18 0912]  Enc Vitals Group     BP 132/86     Pulse Rate 72     Resp 18     Temp 97.9 F (36.6 C)     Temp Source Oral     SpO2 98 %     Weight 211 lb (95.7 kg)     Height  6' (1.829 m)     Head Circumference      Peak Flow      Pain Score 5     Pain Loc      Pain Edu?      Excl. in GC?    Updated Vital Signs BP 132/86 (BP Location: Left Arm)   Pulse 72   Temp 97.9 F (36.6 C) (Oral)   Resp 18   Ht 6' (1.829 m)   Wt 95.7 kg   SpO2 98%   BMI 28.62 kg/m   Visual Acuity Right Eye Distance:   Left Eye Distance:   Bilateral Distance:    Right Eye Near:   Left Eye Near:    Bilateral Near:     Physical Exam  Constitutional: He is oriented to person, place, and time. He appears well-developed. No distress.  HENT:  Head: Normocephalic and atraumatic.  Pulmonary/Chest: Effort normal. No respiratory distress.  Neurological: He is alert and oriented to person, place, and time.  Skin:  Diffuse erythematous,  raised rash.  Most prominent over the trunk and buttock.  Psychiatric: He has a normal mood and affect. His behavior is normal.  Nursing note and vitals reviewed.  UC Treatments / Results  Labs (all labs ordered are listed, but only abnormal results are displayed) Labs Reviewed - No data to display  EKG None  Radiology No results found.  Procedures Procedures (including critical care time)  Medications Ordered in UC Medications  methylPREDNISolone sodium succinate (SOLU-MEDROL) 40 mg/mL injection 80 mg (80 mg Intramuscular Given 03/12/18 0940)    Initial Impression / Assessment and Plan / UC Course  I have reviewed the triage vital signs and the nursing notes.  Pertinent labs & imaging results that were available during my care of the patient were reviewed by me and considered in my medical decision making (see chart for details).    57 year old male presents with urticaria.  Possibly related to Trulicity.  Advised to stop.  Follow-up with his primary care provider regarding this.  Treating with Solu-Medrol injection today.  Atarax for itching and rash as well.  Final Clinical Impressions(s) / UC Diagnoses   Final diagnoses:  Hives     Discharge Instructions     Stop Trulicity.  Atarax as prescribed.  Follow up with your PCP.  Take care  Dr. Adriana Simas    ED Prescriptions    Medication Sig Dispense Auth. Provider   hydrOXYzine (ATARAX/VISTARIL) 25 MG tablet Take 1 tablet (25 mg total) by mouth every 8 (eight) hours as needed. 30 tablet Tommie Sams, DO     Controlled Substance Prescriptions [place] Controlled Substance Registry consulted? Not Applicable   Tommie Sams, DO 03/12/18 1000

## 2018-09-05 ENCOUNTER — Ambulatory Visit (INDEPENDENT_AMBULATORY_CARE_PROVIDER_SITE_OTHER): Payer: BC Managed Care – PPO | Admitting: Family Medicine

## 2018-09-05 ENCOUNTER — Other Ambulatory Visit: Payer: Self-pay | Admitting: Family Medicine

## 2018-09-05 ENCOUNTER — Other Ambulatory Visit: Payer: Self-pay

## 2018-09-05 ENCOUNTER — Encounter: Payer: Self-pay | Admitting: Family Medicine

## 2018-09-05 DIAGNOSIS — E119 Type 2 diabetes mellitus without complications: Secondary | ICD-10-CM

## 2018-09-05 DIAGNOSIS — G5793 Unspecified mononeuropathy of bilateral lower limbs: Secondary | ICD-10-CM

## 2018-09-05 DIAGNOSIS — I10 Essential (primary) hypertension: Secondary | ICD-10-CM | POA: Diagnosis not present

## 2018-09-05 DIAGNOSIS — F339 Major depressive disorder, recurrent, unspecified: Secondary | ICD-10-CM

## 2018-09-05 DIAGNOSIS — E1142 Type 2 diabetes mellitus with diabetic polyneuropathy: Secondary | ICD-10-CM

## 2018-09-05 DIAGNOSIS — E785 Hyperlipidemia, unspecified: Secondary | ICD-10-CM | POA: Diagnosis not present

## 2018-09-05 MED ORDER — TRIAMCINOLONE ACETONIDE 0.1 % EX CREA: 1.0000 "application " | TOPICAL_CREAM | Freq: Two times a day (BID) | CUTANEOUS | 0 refills | Status: DC

## 2018-09-05 MED ORDER — PREGABALIN 75 MG PO CAPS: 75.0000 mg | ORAL_CAPSULE | Freq: Two times a day (BID) | ORAL | 1 refills | Status: DC

## 2018-09-05 MED ORDER — SITAGLIPTIN PHOSPHATE 100 MG PO TABS: 100.0000 mg | ORAL_TABLET | Freq: Every day | ORAL | 2 refills | Status: DC

## 2018-09-05 MED ORDER — LOSARTAN POTASSIUM-HCTZ 100-12.5 MG PO TABS: 1.0000 | ORAL_TABLET | Freq: Every day | ORAL | 4 refills | Status: DC

## 2018-09-05 MED ORDER — METFORMIN HCL ER (MOD) 500 MG PO TB24: 1000.0000 mg | ORAL_TABLET | Freq: Two times a day (BID) | ORAL | 2 refills | Status: DC

## 2018-09-05 MED ORDER — ATORVASTATIN CALCIUM 20 MG PO TABS: 20.0000 mg | ORAL_TABLET | Freq: Every day | ORAL | 4 refills | Status: DC

## 2018-09-05 MED ORDER — AMLODIPINE BESYLATE 10 MG PO TABS: 10.0000 mg | ORAL_TABLET | Freq: Every day | ORAL | 4 refills | Status: DC

## 2018-09-05 NOTE — Telephone Encounter (Signed)
[  09/05/2018 2:05 PM]  Landis Martins:   pt b [id number] script needs to be metformin er 500 so it can be covered by his insurance without an prior auth can you escript them another one for this to [address] please thank you

## 2018-09-05 NOTE — Telephone Encounter (Signed)
Please see message below about the Metformin as well.

## 2018-09-05 NOTE — Assessment & Plan Note (Signed)
The current medical regimen is effective;  continue present plan and medications.  

## 2018-09-05 NOTE — Assessment & Plan Note (Signed)
Poor control with allergic reaction will add Januvia for now discussed diet nutrition exercise and patient will check back within a month if not getting better with home monitoring will need to add other medications.  Otherwise we will see patient in about 3 months

## 2018-09-05 NOTE — Progress Notes (Addendum)
There were no vitals taken for this visit.   Subjective:    Patient ID: Shawn Shaw, male    DOB: 1961/03/22, 58 y.o.   MRN: [id number]  HPI: Shawn Shaw is a 58 y.o. male  Med check Telemedicine using audio/video telecommunications for a synchronous communication visit. Today's visit due to COVID-19 isolation precautions I connected with and verified that I am speaking with the correct person using two identifiers.   I discussed the limitations, risks, security and privacy concerns of performing an evaluation and management service by telecommunication and the availability of in person appointments. I also discussed with the patient that there may be a patient responsible charge related to this service. The patient expressed understanding and agreed to proceed. The patient's location is home. I am at home.  Relevant past medical, surgical, family and social history reviewed and updated as indicated. Interim medical history since our last visit reviewed. Allergies and medications reviewed and updated.  Review of Systems  Constitutional: Negative.   Respiratory: Negative.   Cardiovascular: Negative.     Per HPI unless specifically indicated above     Objective:    There were no vitals taken for this visit.  Wt Readings from Last 3 Encounters:  03/12/18 211 lb (95.7 kg)  02/28/18 222 lb (100.7 kg)  11/22/17 220 lb (99.8 kg)    Physical Exam  Results for orders placed or performed in visit on 02/28/18  LP+ALT+AST Piccolo, Arrow ElectronicsWaived  Result Value Ref Range   ALT (SGPT) Piccolo, Waived 39 10 - 47 U/L   AST (SGOT) Piccolo, Waived 31 11 - 38 U/L   Cholesterol Piccolo, Waived 124 <200 mg/dL   HDL Chol Piccolo, Waived 32 (L) >59 mg/dL   Triglycerides Piccolo,Waived 278 (H) <150 mg/dL   Chol/HDL Ratio Piccolo,Waive 3.9 mg/dL   LDL Chol Calc Piccolo Waived 37 <100 mg/dL   VLDL Chol Calc Piccolo,Waive 56 (H) <30 mg/dL  Basic metabolic panel  Result Value Ref Range   Glucose 175 (H) 65 - 99 mg/dL   BUN 16 6 - 24 mg/dL   Creatinine, Ser 4.091.08 0.76 - 1.27 mg/dL   GFR calc non Af Amer 75 >59 mL/min/1.73   GFR calc Af Amer 87 >59 mL/min/1.73   BUN/Creatinine Ratio 15 9 - 20   Sodium 138 134 - 144 mmol/L   Potassium 4.3 3.5 - 5.2 mmol/L   Chloride 99 96 - 106 mmol/L   CO2 23 20 - 29 mmol/L   Calcium 10.0 8.7 - 10.2 mg/dL  Bayer DCA Hb W1XA1c Waived  Result Value Ref Range   HB A1C (BAYER DCA - WAIVED) 7.8 (H) <7.0 %      Assessment & Plan:   Problem List Items Addressed This Visit      Cardiovascular and Mediastinum   Hypertension    The current medical regimen is effective;  continue present plan and medications.       Relevant Medications   losartan-hydrochlorothiazide (HYZAAR) 100-12.5 MG tablet   amLODipine (NORVASC) 10 MG tablet   atorvastatin (LIPITOR) 20 MG tablet     Nervous and Auditory   DM type 2 with diabetic peripheral neuropathy (HCC)    Poor control with allergic reaction will add Januvia for now discussed diet nutrition exercise and patient will check back within a month if not getting better with home monitoring will need to add other medications.  Otherwise we will see patient in about 3 months      Relevant Medications  metFORMIN (GLUMETZA) 500 MG (MOD) 24 hr tablet   sitaGLIPtin (JANUVIA) 100 MG tablet   losartan-hydrochlorothiazide (HYZAAR) 100-12.5 MG tablet   atorvastatin (LIPITOR) 20 MG tablet   pregabalin (LYRICA) 75 MG capsule   Neuropathy, leg   Relevant Medications   pregabalin (LYRICA) 75 MG capsule     Other   Hyperlipidemia    The current medical regimen is effective;  continue present plan and medications.       Relevant Medications   losartan-hydrochlorothiazide (HYZAAR) 100-12.5 MG tablet   amLODipine (NORVASC) 10 MG tablet   atorvastatin (LIPITOR) 20 MG tablet   RESOLVED: Depression, recurrent (HCC)    The current medical regimen is effective;  continue present plan and medications.           I discussed the assessment and treatment plan with the patient. The patient was provided an opportunity to ask questions and all were answered. The patient agreed with the plan and demonstrated an understanding of the instructions.   The patient was advised to call back or seek an in-person evaluation if the symptoms worsen or if the condition fails to improve as anticipated.   I provided 21+ minutes of time during this encounter. Follow up plan: Return in about 3 months (around 12/05/2018) for Hemoglobin A1c.

## 2018-10-18 ENCOUNTER — Other Ambulatory Visit: Payer: Self-pay

## 2018-10-18 ENCOUNTER — Ambulatory Visit (INDEPENDENT_AMBULATORY_CARE_PROVIDER_SITE_OTHER): Payer: BC Managed Care – PPO | Admitting: Family Medicine

## 2018-10-18 ENCOUNTER — Encounter: Payer: Self-pay | Admitting: Family Medicine

## 2018-10-18 VITALS — BP 138/82 | HR 57 | Temp 98.2°F

## 2018-10-18 DIAGNOSIS — M545 Low back pain, unspecified: Secondary | ICD-10-CM

## 2018-10-18 MED ORDER — TRIAMCINOLONE ACETONIDE 40 MG/ML IJ SUSP
40.0000 mg | Freq: Once | INTRAMUSCULAR | Status: AC
  Administered 2018-10-18: 40 mg via INTRAMUSCULAR

## 2018-10-18 NOTE — Progress Notes (Signed)
BP 138/82   Pulse (!) 57   Temp 98.2 F (36.8 C) (Oral)   SpO2 98%    Subjective:    Patient ID: Shawn Shaw, male    DOB: Sep 21, 1960, 58 y.o.   MRN: [id number]  HPI: Shawn Shaw is a 58 y.o. male  Chief Complaint  Patient presents with  . Back Pain    pt states he pulled a muscle in his lower right back a week ago    Right lower back pain x 1 week after working on some things on a boat. Achy, pulling pain that does not radiate down legs or cause numbness and tingling (beyond his baseline neuropathy sxs). Wearing a back brace and using tylenol and ibuprofen which helps just a little bit. Denies urinary sxs, fevers, leg weakness.   Relevant past medical, surgical, family and social history reviewed and updated as indicated. Interim medical history since our last visit reviewed. Allergies and medications reviewed and updated.  Review of Systems  Per HPI unless specifically indicated above     Objective:    BP 138/82   Pulse (!) 57   Temp 98.2 F (36.8 C) (Oral)   SpO2 98%   Wt Readings from Last 3 Encounters:  03/12/18 211 lb (95.7 kg)  02/28/18 222 lb (100.7 kg)  11/22/17 220 lb (99.8 kg)    Physical Exam Vitals signs and nursing note reviewed.  Constitutional:      Appearance: Normal appearance.  HENT:     Head: Atraumatic.  Eyes:     Extraocular Movements: Extraocular movements intact.     Conjunctiva/sclera: Conjunctivae normal.  Neck:     Musculoskeletal: Normal range of motion and neck supple.  Cardiovascular:     Rate and Rhythm: Normal rate and regular rhythm.  Pulmonary:     Effort: Pulmonary effort is normal.     Breath sounds: Normal breath sounds.  Musculoskeletal: Normal range of motion.        General: Tenderness (minimal right low back ttp) present.     Comments: ROM full and intact, pain reproduced with adduction of right leg -SLR  Skin:    General: Skin is warm and dry.  Neurological:     General: No focal deficit present.   Mental Status: He is oriented to person, place, and time.  Psychiatric:        Mood and Affect: Mood normal.        Thought Content: Thought content normal.        Judgment: Judgment normal.     Results for orders placed or performed in visit on 02/28/18  LP+ALT+AST Piccolo, Arrow ElectronicsWaived  Result Value Ref Range   ALT (SGPT) Piccolo, Waived 39 10 - 47 U/L   AST (SGOT) Piccolo, Waived 31 11 - 38 U/L   Cholesterol Piccolo, Waived 124 <200 mg/dL   HDL Chol Piccolo, Waived 32 (L) >59 mg/dL   Triglycerides Piccolo,Waived 278 (H) <150 mg/dL   Chol/HDL Ratio Piccolo,Waive 3.9 mg/dL   LDL Chol Calc Piccolo Waived 37 <100 mg/dL   VLDL Chol Calc Piccolo,Waive 56 (H) <30 mg/dL  Basic metabolic panel  Result Value Ref Range   Glucose 175 (H) 65 - 99 mg/dL   BUN 16 6 - 24 mg/dL   Creatinine, Ser 4.091.08 0.76 - 1.27 mg/dL   GFR calc non Af Amer 75 >59 mL/min/1.73   GFR calc Af Amer 87 >59 mL/min/1.73   BUN/Creatinine Ratio 15 9 - 20   Sodium 138 134 -  144 mmol/L   Potassium 4.3 3.5 - 5.2 mmol/L   Chloride 99 96 - 106 mmol/L   CO2 23 20 - 29 mmol/L   Calcium 10.0 8.7 - 10.2 mg/dL  Bayer DCA Hb K2H Waived  Result Value Ref Range   HB A1C (BAYER DCA - WAIVED) 7.8 (H) <7.0 %      Assessment & Plan:   Problem List Items Addressed This Visit    None    Visit Diagnoses    Acute right-sided low back pain without sciatica    -  Primary   IM kenalog, NSAIDs and tylenol prn, stretches, heat. F/u if worsening or not improving   Relevant Medications   triamcinolone acetonide (KENALOG-40) injection 40 mg (Completed)       Follow up plan: Return if symptoms worsen or fail to improve.

## 2018-11-18 ENCOUNTER — Encounter: Payer: Self-pay | Admitting: Family Medicine

## 2018-11-18 ENCOUNTER — Ambulatory Visit (INDEPENDENT_AMBULATORY_CARE_PROVIDER_SITE_OTHER): Payer: BC Managed Care – PPO | Admitting: Family Medicine

## 2018-11-18 ENCOUNTER — Other Ambulatory Visit: Payer: Self-pay

## 2018-11-18 ENCOUNTER — Ambulatory Visit: Payer: Self-pay | Admitting: *Deleted

## 2018-11-18 VITALS — Temp 97.5°F | Ht 69.0 in | Wt 205.0 lb

## 2018-11-18 DIAGNOSIS — Z20822 Contact with and (suspected) exposure to covid-19: Secondary | ICD-10-CM

## 2018-11-18 DIAGNOSIS — Z20828 Contact with and (suspected) exposure to other viral communicable diseases: Secondary | ICD-10-CM

## 2018-11-18 NOTE — Progress Notes (Signed)
Temp (!) 97.5 F (36.4 C) (Temporal)   Ht 5\' 9"  (1.753 m)   Wt 205 lb (93 kg)   BMI 30.27 kg/m    Subjective:    Patient ID: Shawn Shaw, male    DOB: 07-31-1960, 58 y.o.   MRN: [id number]  HPI: Shawn Shaw is a 58 y.o. male  Chief Complaint  Patient presents with  . COVID Exposure    Last week patient ran a fever and states he's very tired, fatigued. Nephew tested positive today. Patient has been around nephew.   . Fatigue  . Generalized Body Aches  . Fever    . This visit was completed via telephone due to the restrictions of the COVID-19 pandemic. All issues as above were discussed and addressed. Physical exam was done as above through visual confirmation on telephone. If it was felt that the patient should be evaluated in the office, they were directed there. The patient verbally consented to this visit. . Location of the patient: home . Location of the provider: home . Those involved with this call:  . Provider: Merrie Roof, PA-C . CMA: Merilyn Baba, [place] . Front Desk/Registration: Jill Side  . Time spent on call: 15 minutes on the phone discussing health concerns. 5 minutes total spent in review of patient's record and preparation of their chart. I verified patient identity using two factors (patient name and date of birth). Patient consents verbally to being seen via telemedicine visit today.   1 week of fever, fatigue, chest tightness, some cough. Has been around nephew who tested positive today. Taking tylenol, mucinex, and ibuprofen with some mild relief. Denies CP, SOB, N/V/D. No hx of pulmonary dz.   Relevant past medical, surgical, family and social history reviewed and updated as indicated. Interim medical history since our last visit reviewed. Allergies and medications reviewed and updated.  Review of Systems  Per HPI unless specifically indicated above     Objective:    Temp (!) 97.5 F (36.4 C) (Temporal)   Ht 5\' 9"  (1.753 m)   Wt 205 lb (93  kg)   BMI 30.27 kg/m   Wt Readings from Last 3 Encounters:  11/18/18 205 lb (93 kg)  03/12/18 211 lb (95.7 kg)  02/28/18 222 lb (100.7 kg)    Physical Exam  Unable to perform PE given patient's lack of access to video technology for today's visit  Results for orders placed or performed in visit on 02/28/18  LP+ALT+AST Piccolo, [employer]  Result Value Ref Range   ALT (SGPT) Piccolo, Waived 39 10 - 47 U/L   AST (SGOT) Piccolo, Waived 31 11 - 38 U/L   Cholesterol Piccolo, Waived 124 <200 mg/dL   HDL Chol Piccolo, Waived 32 (L) >59 mg/dL   Triglycerides Piccolo,Waived 278 (H) <150 mg/dL   Chol/HDL Ratio Piccolo,Waive 3.9 mg/dL   LDL Chol Calc Piccolo Waived 37 <100 mg/dL   VLDL Chol Calc Piccolo,Waive 56 (H) <30 mg/dL  Basic metabolic panel  Result Value Ref Range   Glucose 175 (H) 65 - 99 mg/dL   BUN 16 6 - 24 mg/dL   Creatinine, Ser 1.08 0.76 - 1.27 mg/dL   GFR calc non Af Amer 75 >59 mL/min/1.73   GFR calc Af Amer 87 >59 mL/min/1.73   BUN/Creatinine Ratio 15 9 - 20   Sodium 138 134 - 144 mmol/L   Potassium 4.3 3.5 - 5.2 mmol/L   Chloride 99 96 - 106 mmol/L   CO2 23 20 - 29 mmol/L  Calcium 10.0 8.7 - 10.2 mg/dL  Bayer DCA Hb Z6XA1c Waived  Result Value Ref Range   HB A1C (BAYER DCA - WAIVED) 7.8 (H) <7.0 %      Assessment & Plan:   Problem List Items Addressed This Visit    None    Visit Diagnoses    Exposure to Covid-19 Virus    -  Primary   Will refer for testing, quarantine until results return and sxs improve. Supportive care and return precautions reviewed. Recheck in 1 week       Follow up plan: Return in about 1 week (around 11/25/2018) for Sick f/u.

## 2018-11-18 NOTE — Telephone Encounter (Signed)
Pt and wife called with requesting to be tested for covid-19. He is c/o pain in his chest area, muscular type pain, fatigue and headache. He had a fever last week that went to 102 then he had chills. Also had been around his nephew who tested positive for the virus. Advised of having a virtual appointment to get tested. He voiced understanding. Notified [place] for an appointment. Call transferred to the office Routing to [facility] for review. Reason for Disposition . [1] COVID-19 infection suspected by caller or triager AND [2] mild symptoms (cough, fever, or others) AND [1] no complications or SOB  Answer Assessment - Initial Assessment Questions 1. COVID-19 DIAGNOSIS: "Who made your Coronavirus (COVID-19) diagnosis?" "Was it confirmed by a positive lab test?" If not diagnosed by a HCP, ask "Are there lots of cases (community spread) where you live?" (See public health department website, if unsure)     In the community 2. ONSET: "When did the COVID-19 symptoms start?"      About a week 3. WORST SYMPTOM: "What is your worst symptom?" (e.g., cough, fever, shortness of breath, muscle aches)     Chest hurts and cough 4. COUGH: "Do you have a cough?" If so, ask: "How bad is the cough?"       yes 5. FEVER: "Do you have a fever?" If so, ask: "What is your temperature, how was it measured, and when did it start?"     No fever now 6. RESPIRATORY STATUS: "Describe your breathing?" (e.g., shortness of breath, wheezing, unable to speak)      Some shortness of breath when taking a deep breath 7. BETTER-SAME-WORSE: "Are you getting better, staying the same or getting worse compared to yesterday?"  If getting worse, ask, "In what way?"     Little better 8. HIGH RISK DISEASE: "Do you have any chronic medical problems?" (e.g., asthma, heart or lung disease, weak immune system, etc.)     Hypertension, diabetes 9. PREGNANCY: "Is there any chance you are pregnant?" "When was your last menstrual  period?"     no 10. OTHER SYMPTOMS: "Do you have any other symptoms?"  (e.g., chills, fatigue, headache, loss of smell or taste, muscle pain, sore throat)       Fatigue, muscle pain, headache, had a fever last week up to 102  Protocols used: CORONAVIRUS (COVID-19) DIAGNOSED OR SUSPECTED-A-AH

## 2018-11-19 ENCOUNTER — Other Ambulatory Visit: Payer: Self-pay

## 2018-11-19 ENCOUNTER — Telehealth: Payer: Self-pay | Admitting: *Deleted

## 2018-11-19 DIAGNOSIS — Z20822 Contact with and (suspected) exposure to covid-19: Secondary | ICD-10-CM

## 2018-11-19 NOTE — Telephone Encounter (Signed)
Pt scheduled for covid testing 11/20/18 @ [facility] @ 9:00am. Instructions given and order placed.

## 2018-11-19 NOTE — Telephone Encounter (Signed)
-----   Message from Volney American, [place] sent at 11/18/2018  4:25 PM EDT ----- Regarding: COVID 19 testing Patient with 1 week of fatigue, fever, chest tightness. Nephew who he's been around several times recently tested positive for COVID 19 today [employer]

## 2018-11-20 ENCOUNTER — Other Ambulatory Visit: Payer: Self-pay

## 2018-11-24 LAB — NOVEL CORONAVIRUS, NAA: SARS-CoV-2, NAA: DETECTED — AB

## 2018-12-06 ENCOUNTER — Other Ambulatory Visit: Payer: Self-pay | Admitting: Family Medicine

## 2018-12-06 DIAGNOSIS — I10 Essential (primary) hypertension: Secondary | ICD-10-CM

## 2018-12-10 ENCOUNTER — Other Ambulatory Visit: Payer: Self-pay

## 2018-12-10 ENCOUNTER — Ambulatory Visit (INDEPENDENT_AMBULATORY_CARE_PROVIDER_SITE_OTHER): Payer: BC Managed Care – PPO | Admitting: Family Medicine

## 2018-12-10 ENCOUNTER — Encounter: Payer: Self-pay | Admitting: Family Medicine

## 2018-12-10 DIAGNOSIS — E785 Hyperlipidemia, unspecified: Secondary | ICD-10-CM

## 2018-12-10 DIAGNOSIS — G5793 Unspecified mononeuropathy of bilateral lower limbs: Secondary | ICD-10-CM | POA: Diagnosis not present

## 2018-12-10 DIAGNOSIS — Z0000 Encounter for general adult medical examination without abnormal findings: Secondary | ICD-10-CM

## 2018-12-10 DIAGNOSIS — E119 Type 2 diabetes mellitus without complications: Secondary | ICD-10-CM | POA: Diagnosis not present

## 2018-12-10 DIAGNOSIS — I10 Essential (primary) hypertension: Secondary | ICD-10-CM | POA: Diagnosis not present

## 2018-12-10 DIAGNOSIS — E1142 Type 2 diabetes mellitus with diabetic polyneuropathy: Secondary | ICD-10-CM

## 2018-12-10 MED ORDER — PREGABALIN 75 MG PO CAPS: 75.0000 mg | ORAL_CAPSULE | Freq: Two times a day (BID) | ORAL | 1 refills | Status: DC

## 2018-12-10 MED ORDER — METFORMIN HCL ER (MOD) 500 MG PO TB24: 1000.0000 mg | ORAL_TABLET | Freq: Two times a day (BID) | ORAL | 4 refills | Status: DC

## 2018-12-10 MED ORDER — SITAGLIPTIN PHOSPHATE 100 MG PO TABS: 100.0000 mg | ORAL_TABLET | Freq: Every day | ORAL | 4 refills | Status: DC

## 2018-12-10 MED ORDER — ATORVASTATIN CALCIUM 20 MG PO TABS: 20.0000 mg | ORAL_TABLET | Freq: Every day | ORAL | 4 refills | Status: DC

## 2018-12-10 MED ORDER — LOSARTAN POTASSIUM-HCTZ 100-12.5 MG PO TABS: 1.0000 | ORAL_TABLET | Freq: Every day | ORAL | 4 refills | Status: DC

## 2018-12-10 MED ORDER — AMLODIPINE BESYLATE 10 MG PO TABS: 10.0000 mg | ORAL_TABLET | Freq: Every day | ORAL | 4 refills | Status: DC

## 2018-12-10 MED ORDER — TRIAMCINOLONE ACETONIDE 0.1 % EX CREA: 1.0000 "application " | TOPICAL_CREAM | Freq: Two times a day (BID) | CUTANEOUS | 0 refills | Status: DC

## 2018-12-10 NOTE — Assessment & Plan Note (Signed)
The current medical regimen is effective;  continue present plan and medications.  

## 2018-12-10 NOTE — Progress Notes (Signed)
There were no vitals taken for this visit.   Subjective:    Patient ID: Shawn SatoBobby Viar, male    DOB: 06/28/1960, 58 y.o.   MRN: [id number]  HPI: Shawn SatoBobby Forst is a 58 y.o. male  Med check Patient recovering well from COVID-19 infection has slight residual cough was never hospitalized and otherwise was just sick for couple weeks with a cold and is taken a couple weeks to recover.  Is made it through the quarantined.  With no problems.  His wife is also sick but still has some cough.  Otherwise diabetes blood pressure cholesterol is all done well with no problems or complaints.  Relevant past medical, surgical, family and social history reviewed and updated as indicated. Interim medical history since our last visit reviewed. Allergies and medications reviewed and updated.  Review of Systems  Constitutional: Negative.   HENT: Negative.   Eyes: Negative.   Respiratory: Negative.   Cardiovascular: Negative.   Gastrointestinal: Negative.   Endocrine: Negative.   Genitourinary: Negative.   Musculoskeletal: Negative.   Skin: Negative.   Allergic/Immunologic: Negative.   Neurological: Negative.   Hematological: Negative.   Psychiatric/Behavioral: Negative.     Per HPI unless specifically indicated above     Objective:    There were no vitals taken for this visit.  Wt Readings from Last 3 Encounters:  11/18/18 205 lb (93 kg)  03/12/18 211 lb (95.7 kg)  02/28/18 222 lb (100.7 kg)    Physical Exam  Results for orders placed or performed in visit on 11/19/18  Novel Coronavirus, NAA ([facility])  Result Value Ref Range   SARS-CoV-2, NAA Detected (A) Not Detected      Assessment & Plan:   Problem List Items Addressed This Visit      Cardiovascular and Mediastinum   Hypertension    The current medical regimen is effective;  continue present plan and medications.       Relevant Medications   losartan-hydrochlorothiazide (HYZAAR) 100-12.5 MG tablet   amLODipine (NORVASC) 10  MG tablet   atorvastatin (LIPITOR) 20 MG tablet     Nervous and Auditory   DM type 2 with diabetic peripheral neuropathy (HCC)    The current medical regimen is effective;  continue present plan and medications.       Relevant Medications   sitaGLIPtin (JANUVIA) 100 MG tablet   losartan-hydrochlorothiazide (HYZAAR) 100-12.5 MG tablet   metFORMIN (GLUMETZA) 500 MG (MOD) 24 hr tablet   atorvastatin (LIPITOR) 20 MG tablet   pregabalin (LYRICA) 75 MG capsule   Other Relevant Orders   Bayer DCA Hb A1c Waived   Neuropathy, leg    The current medical regimen is effective;  continue present plan and medications.       Relevant Medications   pregabalin (LYRICA) 75 MG capsule     Other   Hyperlipidemia    The current medical regimen is effective;  continue present plan and medications.       Relevant Medications   losartan-hydrochlorothiazide (HYZAAR) 100-12.5 MG tablet   amLODipine (NORVASC) 10 MG tablet   atorvastatin (LIPITOR) 20 MG tablet    Other Visit Diagnoses    PE (physical exam), annual    -  Primary   Relevant Orders   Comprehensive metabolic panel   Lipid panel   CBC with Differential/Platelet   TSH   Urinalysis, Routine w reflex microscopic   PSA   Diabetes mellitus without complication (HCC)       Relevant Medications   sitaGLIPtin (  JANUVIA) 100 MG tablet   losartan-hydrochlorothiazide (HYZAAR) 100-12.5 MG tablet   metFORMIN (GLUMETZA) 500 MG (MOD) 24 hr tablet   atorvastatin (LIPITOR) 20 MG tablet       Telemedicine using audio/video telecommunications for a synchronous communication visit. Today's visit due to COVID-19 isolation precautions I connected with and verified that I am speaking with the correct person using two identifiers.   I discussed the limitations, risks, security and privacy concerns of performing an evaluation and management service by telecommunication and the availability of in person appointments. I also discussed with the patient  that there may be a patient responsible charge related to this service. The patient expressed understanding and agreed to proceed. The patient's location is work. I am at home.   I discussed the assessment and treatment plan with the patient. The patient was provided an opportunity to ask questions and all were answered. The patient agreed with the plan and demonstrated an understanding of the instructions.   The patient was advised to call back or seek an in-person evaluation if the symptoms worsen or if the condition fails to improve as anticipated.   I provided 21+ minutes of time during this encounter. Follow up plan: Return in about 6 months (around 06/12/2019) for Hemoglobin A1c, BMP,  Lipids, ALT, AST.

## 2019-02-13 ENCOUNTER — Encounter: Payer: Self-pay | Admitting: Family Medicine

## 2019-02-13 ENCOUNTER — Other Ambulatory Visit: Payer: Self-pay

## 2019-02-13 ENCOUNTER — Ambulatory Visit (INDEPENDENT_AMBULATORY_CARE_PROVIDER_SITE_OTHER): Payer: BC Managed Care – PPO | Admitting: Family Medicine

## 2019-02-13 VITALS — BP 123/79 | HR 59 | Temp 98.3°F

## 2019-02-13 DIAGNOSIS — N138 Other obstructive and reflux uropathy: Secondary | ICD-10-CM

## 2019-02-13 DIAGNOSIS — G5793 Unspecified mononeuropathy of bilateral lower limbs: Secondary | ICD-10-CM

## 2019-02-13 DIAGNOSIS — I10 Essential (primary) hypertension: Secondary | ICD-10-CM

## 2019-02-13 DIAGNOSIS — E1142 Type 2 diabetes mellitus with diabetic polyneuropathy: Secondary | ICD-10-CM | POA: Diagnosis not present

## 2019-02-13 DIAGNOSIS — Z0000 Encounter for general adult medical examination without abnormal findings: Secondary | ICD-10-CM | POA: Diagnosis not present

## 2019-02-13 DIAGNOSIS — Z23 Encounter for immunization: Secondary | ICD-10-CM | POA: Diagnosis not present

## 2019-02-13 DIAGNOSIS — N401 Enlarged prostate with lower urinary tract symptoms: Secondary | ICD-10-CM

## 2019-02-13 DIAGNOSIS — E785 Hyperlipidemia, unspecified: Secondary | ICD-10-CM

## 2019-02-13 LAB — UA/M W/RFLX CULTURE, ROUTINE
Bilirubin, UA: NEGATIVE
Glucose, UA: NEGATIVE
Ketones, UA: NEGATIVE
Leukocytes,UA: NEGATIVE
Nitrite, UA: NEGATIVE
Protein,UA: NEGATIVE
RBC, UA: NEGATIVE
Specific Gravity, UA: 1.01 (ref 1.005–1.030)
Urobilinogen, Ur: 0.2 mg/dL (ref 0.2–1.0)
pH, UA: 5.5 (ref 5.0–7.5)

## 2019-02-13 LAB — MICROALBUMIN, URINE WAIVED
Creatinine, Urine Waived: 50 mg/dL (ref 10–300)
Microalb, Ur Waived: 10 mg/L (ref 0–19)
Microalb/Creat Ratio: <30 mg/g (ref <30)

## 2019-02-13 LAB — BAYER DCA HB A1C WAIVED: HB A1C (BAYER DCA - WAIVED): 6.2 % (ref <7.0)

## 2019-02-13 MED ORDER — LOSARTAN POTASSIUM-HCTZ 100-12.5 MG PO TABS: 1.0000 | ORAL_TABLET | Freq: Every day | ORAL | 1 refills | Status: DC

## 2019-02-13 MED ORDER — SITAGLIPTIN PHOSPHATE 100 MG PO TABS: 100.0000 mg | ORAL_TABLET | Freq: Every day | ORAL | 1 refills | Status: DC

## 2019-02-13 MED ORDER — METFORMIN HCL ER (MOD) 500 MG PO TB24: 1000.0000 mg | ORAL_TABLET | Freq: Two times a day (BID) | ORAL | 1 refills | Status: DC

## 2019-02-13 MED ORDER — PREGABALIN 75 MG PO CAPS: 75.0000 mg | ORAL_CAPSULE | Freq: Two times a day (BID) | ORAL | 1 refills | Status: DC

## 2019-02-13 MED ORDER — AMLODIPINE BESYLATE 10 MG PO TABS: 10.0000 mg | ORAL_TABLET | Freq: Every day | ORAL | 1 refills | Status: DC

## 2019-02-13 MED ORDER — ATORVASTATIN CALCIUM 20 MG PO TABS: 20.0000 mg | ORAL_TABLET | Freq: Every day | ORAL | 1 refills | Status: DC

## 2019-02-13 NOTE — Assessment & Plan Note (Signed)
Under good control on current regimen. Continue current regimen. Continue to monitor. Call with any concerns. Refills given. Labs drawn today.   

## 2019-02-13 NOTE — Assessment & Plan Note (Signed)
Follows with urology. Doing well. PSA checked today- will forward results.

## 2019-02-13 NOTE — Assessment & Plan Note (Signed)
Under good control with A1c of 6.2. Continue current regimen. Continue to monitor. Recheck 3 months. Refills given.

## 2019-02-13 NOTE — Patient Instructions (Signed)
Health Maintenance, Male Adopting a healthy lifestyle and getting preventive care are important in promoting health and wellness. Ask your health care provider about:  The right schedule for you to have regular tests and exams.  Things you can do on your own to prevent diseases and keep yourself healthy. What should I know about diet, weight, and exercise? Eat a healthy diet   Eat a diet that includes plenty of vegetables, fruits, low-fat dairy products, and lean protein.  Do not eat a lot of foods that are high in solid fats, added sugars, or sodium. Maintain a healthy weight Body mass index (BMI) is a measurement that can be used to identify possible weight problems. It estimates body fat based on height and weight. Your health care provider can help determine your BMI and help you achieve or maintain a healthy weight. Get regular exercise Get regular exercise. This is one of the most important things you can do for your health. Most adults should:  Exercise for at least 150 minutes each week. The exercise should increase your heart rate and make you sweat (moderate-intensity exercise).  Do strengthening exercises at least twice a week. This is in addition to the moderate-intensity exercise.  Spend less time sitting. Even light physical activity can be beneficial. Watch cholesterol and blood lipids Have your blood tested for lipids and cholesterol at 59 years of age, then have this test every 5 years. You may need to have your cholesterol levels checked more often if:  Your lipid or cholesterol levels are high.  You are older than 59 years of age.  You are at high risk for heart disease. What should I know about cancer screening? Many types of cancers can be detected early and may often be prevented. Depending on your health history and family history, you may need to have cancer screening at various ages. This may include screening for:  Colorectal cancer.  Prostate cancer.   Skin cancer.  Lung cancer. What should I know about heart disease, diabetes, and high blood pressure? Blood pressure and heart disease  High blood pressure causes heart disease and increases the risk of stroke. This is more likely to develop in people who have high blood pressure readings, are of African descent, or are overweight.  Talk with your health care provider about your target blood pressure readings.  Have your blood pressure checked: ? Every 3-5 years if you are 79-12 years of age. ? Every year if you are 26 years old or older.  If you are between the ages of 57 and 39 and are a current or former smoker, ask your health care provider if you should have a one-time screening for abdominal aortic aneurysm (AAA). Diabetes Have regular diabetes screenings. This checks your fasting blood sugar level. Have the screening done:  Once every three years after age 15 if you are at a normal weight and have a low risk for diabetes.  More often and at a younger age if you are overweight or have a high risk for diabetes. What should I know about preventing infection? Hepatitis B If you have a higher risk for hepatitis B, you should be screened for this virus. Talk with your health care provider to find out if you are at risk for hepatitis B infection. Hepatitis C Blood testing is recommended for:  Everyone born from [birth year] through 1966.  Anyone with known risk factors for hepatitis C. Sexually transmitted infections (STIs)  You should be screened each year  for STIs, including gonorrhea and chlamydia, if: ? You are sexually active and are younger than 59 years of age. ? You are older than 59 years of age and your health care provider tells you that you are at risk for this type of infection. ? Your sexual activity has changed since you were last screened, and you are at increased risk for chlamydia or gonorrhea. Ask your health care provider if you are at risk.  Ask your health care  provider about whether you are at high risk for HIV. Your health care provider may recommend a prescription medicine to help prevent HIV infection. If you choose to take medicine to prevent HIV, you should first get tested for HIV. You should then be tested every 3 months for as long as you are taking the medicine. Follow these instructions at home: Lifestyle  Do not use any products that contain nicotine or tobacco, such as cigarettes, e-cigarettes, and chewing tobacco. If you need help quitting, ask your health care provider.  Do not use street drugs.  Do not share needles.  Ask your health care provider for help if you need support or information about quitting drugs. Alcohol use  Do not drink alcohol if your health care provider tells you not to drink.  If you drink alcohol: ? Limit how much you have to 0-2 drinks a day. ? Be aware of how much alcohol is in your drink. In the [place] one drink equals one 12 oz bottle of beer (355 mL), one 5 oz glass of wine (148 mL), or one 1 oz glass of hard liquor (44 mL). General instructions  Schedule regular health, dental, and eye exams.  Stay current with your vaccines.  Tell your health care provider if: ? You often feel depressed. ? You have ever been abused or do not feel safe at home. Summary  Adopting a healthy lifestyle and getting preventive care are important in promoting health and wellness.  Follow your health care provider's instructions about healthy diet, exercising, and getting tested or screened for diseases.  Follow your health care provider's instructions on monitoring your cholesterol and blood pressure. This information is not intended to replace advice given to you by your health care provider. Make sure you discuss any questions you have with your health care provider. Document Released: 10/28/2007 Document Revised: 04/24/2018 Document Reviewed: 04/24/2018 Elsevier Patient Education  [place]. Influenza  (Flu) Vaccine (Inactivated or Recombinant): What You Need to Know 1. Why get vaccinated? Influenza vaccine can prevent influenza (flu). Flu is a contagious disease that spreads around the [place] every year, usually between October and May. Anyone can get the flu, but it is more dangerous for some people. Infants and young children, people 42 years of age and older, pregnant women, and people with certain health conditions or a weakened immune system are at greatest risk of flu complications. Pneumonia, bronchitis, sinus infections and ear infections are examples of flu-related complications. If you have a medical condition, such as heart disease, cancer or diabetes, flu can make it worse. Flu can cause fever and chills, sore throat, muscle aches, fatigue, cough, headache, and runny or stuffy nose. Some people may have vomiting and diarrhea, though this is more common in children than adults. Each year thousands of people in the [place] die from flu, and many more are hospitalized. Flu vaccine prevents millions of illnesses and flu-related visits to the doctor each year. 2. Influenza vaccine CDC recommends everyone 6 months  of age and older get vaccinated every flu season. Children 6 months through 69 years of age may need 2 doses during a single flu season. Everyone else needs only 1 dose each flu season. It takes about 2 weeks for protection to develop after vaccination. There are many flu viruses, and they are always changing. Each year a new flu vaccine is made to protect against three or four viruses that are likely to cause disease in the upcoming flu season. Even when the vaccine doesn't exactly match these viruses, it may still provide some protection. Influenza vaccine does not cause flu. Influenza vaccine may be given at the same time as other vaccines. 3. Talk with your health care provider Tell your vaccine provider if the person getting the vaccine:  Has had an allergic  reaction after a previous dose of influenza vaccine, or has any severe, life-threatening allergies.  Has ever had Guillain-Barr Syndrome (also called GBS). In some cases, your health care provider may decide to postpone influenza vaccination to a future visit. People with minor illnesses, such as a cold, may be vaccinated. People who are moderately or severely ill should usually wait until they recover before getting influenza vaccine. Your health care provider can give you more information. 4. Risks of a vaccine reaction  Soreness, redness, and swelling where shot is given, fever, muscle aches, and headache can happen after influenza vaccine.  There may be a very small increased risk of Guillain-Barr Syndrome (GBS) after inactivated influenza vaccine (the flu shot). Young children who get the flu shot along with pneumococcal vaccine (PCV13), and/or DTaP vaccine at the same time might be slightly more likely to have a seizure caused by fever. Tell your health care provider if a child who is getting flu vaccine has ever had a seizure. People sometimes faint after medical procedures, including vaccination. Tell your provider if you feel dizzy or have vision changes or ringing in the ears. As with any medicine, there is a very remote chance of a vaccine causing a severe allergic reaction, other serious injury, or death. 5. What if there is a serious problem? An allergic reaction could occur after the vaccinated person leaves the clinic. If you see signs of a severe allergic reaction (hives, swelling of the face and throat, difficulty breathing, a fast heartbeat, dizziness, or weakness), call 9-1-1 and get the person to the nearest hospital. For other signs that concern you, call your health care provider. Adverse reactions should be reported to the Vaccine Adverse Event Reporting System (VAERS). Your health care provider will usually file this report, or you can do it yourself. Visit the VAERS  website at www.vaers.SamedayNews.es or call (684)251-9633.VAERS is only for reporting reactions, and VAERS staff do not give medical advice. 6. The National Vaccine Injury Compensation Program The [employer] Vaccine Injury Compensation Program (VICP) is a federal program that was created to compensate people who may have been injured by certain vaccines. Visit the VICP website at GoldCloset.com.ee or call 912-194-7252 to learn about the program and about filing a claim. There is a time limit to file a claim for compensation. 7. How can I learn more?  Ask your healthcare provider.  Call your local or state health department.  Contact the Centers for Disease Control and Prevention (CDC): ? Call (720)147-2177 (1-800-CDC-INFO) or ? Visit CDC's https://gibson.com/ Vaccine Information Statement (Interim) Inactivated Influenza Vaccine (12/27/2017) This information is not intended to replace advice given to you by your health care provider. Make sure you discuss  any questions you have with your health care provider. Document Released: 02/23/2006 Document Revised: 08/20/2018 Document Reviewed: 12/31/2017 Elsevier Patient Education  [place]. Pneumococcal Polysaccharide Vaccine (PPSV23): What You Need to Know 1. Why get vaccinated? Pneumococcal polysaccharide vaccine (PPSV23) can prevent pneumococcal disease. Pneumococcal disease refers to any illness caused by pneumococcal bacteria. These bacteria can cause many types of illnesses, including pneumonia, which is an infection of the lungs. Pneumococcal bacteria are one of the most common causes of pneumonia. Besides pneumonia, pneumococcal bacteria can also cause:  Ear infections  Sinus infections  Meningitis (infection of the tissue covering the brain and spinal cord)  Bacteremia (bloodstream infection) Anyone can get pneumococcal disease, but children under 55 years of age, people with certain medical conditions, adults 44 years or  older, and cigarette smokers are at the highest risk. Most pneumococcal infections are mild. However, some can result in long-term problems, such as brain damage or hearing loss. Meningitis, bacteremia, and pneumonia caused by pneumococcal disease can be fatal. 2. PPSV23 PPSV23 protects against 23 types of bacteria that cause pneumococcal disease. PPSV23 is recommended for:  All adults 17 years or older,  Anyone 2 years or older with certain medical conditions that can lead to an increased risk for pneumococcal disease. Most people need only one dose of PPSV23. A second dose of PPSV23, and another type of pneumococcal vaccine called PCV13, are recommended for certain high-risk groups. Your health care provider can give you more information. People 65 years or older should get a dose of PPSV23 even if they have already gotten one or more doses of the vaccine before they turned 38. 3. Talk with your health care provider Tell your vaccine provider if the person getting the vaccine:  Has had an allergic reaction after a previous dose of PPSV23, or has any severe, life-threatening allergies. In some cases, your health care provider may decide to postpone PPSV23 vaccination to a future visit. People with minor illnesses, such as a cold, may be vaccinated. People who are moderately or severely ill should usually wait until they recover before getting PPSV23. Your health care provider can give you more information. 4. Risks of a vaccine reaction  Redness or pain where the shot is given, feeling tired, fever, or muscle aches can happen after PPSV23. People sometimes faint after medical procedures, including vaccination. Tell your provider if you feel dizzy or have vision changes or ringing in the ears. As with any medicine, there is a very remote chance of a vaccine causing a severe allergic reaction, other serious injury, or death. 5. What if there is a serious problem? An allergic reaction could  occur after the vaccinated person leaves the clinic. If you see signs of a severe allergic reaction (hives, swelling of the face and throat, difficulty breathing, a fast heartbeat, dizziness, or weakness), call 9-1-1 and get the person to the nearest hospital. For other signs that concern you, call your health care provider. Adverse reactions should be reported to the Vaccine Adverse Event Reporting System (VAERS). Your health care provider will usually file this report, or you can do it yourself. Visit the VAERS website at www.vaers.SamedayNews.es or call 657 117 5542. VAERS is only for reporting reactions, and VAERS staff do not give medical advice. 6. How can I learn more?  Ask your health care provider.  Call your local or state health department.  Contact the Centers for Disease Control and Prevention (CDC): ? Call 434-201-9102 (1-800-CDC-INFO) or ? Visit CDC's website at http://hunter.com/  CDC Vaccine Information Statement PPSV23 Vaccine (03/13/2018) This information is not intended to replace advice given to you by your health care provider. Make sure you discuss any questions you have with your health care provider. Document Released: 02/26/2006 Document Revised: 08/20/2018 Document Reviewed: 12/11/2017 Elsevier Patient Education  2020 [employer].

## 2019-02-13 NOTE — Progress Notes (Signed)
BP 123/79    Pulse (!) 59    Temp 98.3 F (36.8 C)    SpO2 98%    Subjective:    Patient ID: Shawn Shaw, male    DOB: [birth year], 59 y.o.   MRN: [id number]  HPI: Shawn Shaw is a 58 y.o. male presenting on 02/13/2019 for comprehensive medical examination. Current medical complaints include:  HYPERTENSION / HYPERLIPIDEMIA Satisfied with current treatment? yes Duration of hypertension: chronic BP monitoring frequency: not checking BP medication side effects: no Past BP meds:  Losartan, HCTZ, amlodipine Duration of hyperlipidemia: chronic Cholesterol medication side effects: no Cholesterol supplements: none Past cholesterol medications: atorvastatin Medication compliance: excellent compliance Aspirin: no Recent stressors: no Recurrent headaches: no Visual changes: no Palpitations: no Dyspnea: no Chest pain: no Lower extremity edema: no Dizzy/lightheaded: no  DIABETES Hypoglycemic episodes:no Polydipsia/polyuria: no Visual disturbance: no Chest pain: no Paresthesias: no Glucose Monitoring: yes  Accucheck frequency: Daily  Fasting glucose: 135 Taking Insulin?: no Blood Pressure Monitoring: not checking Retinal Examination: Up to Date Foot Exam: Up to Date Diabetic Education: Completed Pneumovax: Given today Influenza: Given today Aspirin: yes  BPH BPH status: controlled Satisfied with current treatment?: no Medication side effects: no Medication compliance: excellent compliance Duration: chronic Nocturia: 1-2x per night Urinary frequency:no Incomplete voiding: no Urgency: no Weak urinary stream: no Straining to start stream: no Dysuria: no Onset: gradual Severity: mild  He currently lives with: wife Interim Problems from his last visit: no  Depression Screen done today and results listed below:  Depression screen [facility] 2/9 02/13/2019 02/13/2019 08/23/2017 05/24/2017 07/13/2016  Decreased Interest 0 0 0 0 0  Down, Depressed, Hopeless 0 0 0 0 0  PHQ - 2  Score 0 0 0 0 0  Altered sleeping 0 - - - -  Tired, decreased energy 0 - - - -  Change in appetite 0 - - - -  Feeling bad or failure about yourself  0 - - - -  Trouble concentrating 0 - - - -  Moving slowly or fidgety/restless 0 - - - -  Suicidal thoughts 0 - - - -  PHQ-9 Score 0 - - - -  Difficult doing work/chores Not difficult at all - - - -    Past Medical History:  Past Medical History:  Diagnosis Date   Anxiety    Depression    Diabetes mellitus without complication (HCC)    Hyperlipidemia    Hypertension     Surgical History:  Past Surgical History:  Procedure Laterality Date   PTERYGIUM EXCISION Right     Medications:  Current Outpatient Medications on File Prior to Visit  Medication Sig   finasteride (PROSCAR) 5 MG tablet 5 mg daily.   triamcinolone cream (KENALOG) 0.1 % Apply 1 application topically 2 (two) times daily.   No current facility-administered medications on file prior to visit.     Allergies:  Allergies  Allergen Reactions   Trulicity [Dulaglutide] Rash   Lisinopril Cough    Social History:  Social History   Socioeconomic History   Marital status: Unknown    Spouse name: Not on file   Number of children: Not on file   Years of education: Not on file   Highest education level: Not on file  Occupational History   Not on file  Social Needs   Financial resource strain: Not on file   Food insecurity    Worry: Not on file    Inability: Not on file   Transportation  needs    Medical: Not on file    Non-medical: Not on file  Tobacco Use   Smoking status: Former Smoker    Types: Cigarettes    Quit date: 12/22/2008    Years since quitting: 10.1   Smokeless tobacco: Never Used  Substance and Sexual Activity   Alcohol use: Not Currently    Alcohol/week: 0.0 standard drinks   Drug use: No   Sexual activity: Not on file  Lifestyle   Physical activity    Days per week: Not on file    Minutes per session: Not on  file   Stress: Not on file  Relationships   Social connections    Talks on phone: Not on file    Gets together: Not on file    Attends religious service: Not on file    Active member of club or organization: Not on file    Attends meetings of clubs or organizations: Not on file    Relationship status: Not on file   Intimate partner violence    Fear of current or ex partner: Not on file    Emotionally abused: Not on file    Physically abused: Not on file    Forced sexual activity: Not on file  Other Topics Concern   Not on file  Social History Narrative   Not on file   Social History   Tobacco Use  Smoking Status Former Smoker   Types: Cigarettes   Quit date: 12/22/2008   Years since quitting: 10.1  Smokeless Tobacco Never Used   Social History   Substance and Sexual Activity  Alcohol Use Not Currently   Alcohol/week: 0.0 standard drinks    Family History:  Family History  Family history unknown: Yes    Past medical history, surgical history, medications, allergies, family history and social history reviewed with patient today and changes made to appropriate areas of the chart.   Review of Systems  Constitutional: Negative.   HENT: Negative.   Eyes: Negative.   Respiratory: Negative.   Cardiovascular: Negative.   Gastrointestinal: Positive for diarrhea and heartburn (with food choices). Negative for abdominal pain, blood in stool, constipation, melena, nausea and vomiting.  Genitourinary: Negative.   Musculoskeletal: Negative.   Skin: Negative.   Neurological: Negative.   Endo/Heme/Allergies: Positive for environmental allergies. Negative for polydipsia. Does not bruise/bleed easily.  Psychiatric/Behavioral: Negative.     All other ROS negative except what is listed above and in the HPI.      Objective:    BP 123/79    Pulse (!) 59    Temp 98.3 F (36.8 C)    SpO2 98%   Wt Readings from Last 3 Encounters:  11/18/18 205 lb (93 kg)  03/12/18 211  lb (95.7 kg)  02/28/18 222 lb (100.7 kg)    Physical Exam Vitals signs and nursing note reviewed.  Constitutional:      General: He is not in acute distress.    Appearance: Normal appearance. He is obese. He is not ill-appearing, toxic-appearing or diaphoretic.  HENT:     Head: Normocephalic and atraumatic.     Right Ear: Tympanic membrane, ear canal and external ear normal. There is no impacted cerumen.     Left Ear: Tympanic membrane, ear canal and external ear normal. There is no impacted cerumen.     Nose: Nose normal. No congestion or rhinorrhea.     Mouth/Throat:     Mouth: Mucous membranes are moist.     Pharynx:  Oropharynx is clear. No oropharyngeal exudate or posterior oropharyngeal erythema.  Eyes:     General: No scleral icterus.       Right eye: No discharge.        Left eye: No discharge.     Extraocular Movements: Extraocular movements intact.     Conjunctiva/sclera: Conjunctivae normal.     Pupils: Pupils are equal, round, and reactive to light.  Neck:     Musculoskeletal: Normal range of motion and neck supple. No neck rigidity or muscular tenderness.     Vascular: No carotid bruit.  Cardiovascular:     Rate and Rhythm: Normal rate and regular rhythm.     Pulses: Normal pulses.     Heart sounds: No murmur. No friction rub. No gallop.   Pulmonary:     Effort: Pulmonary effort is normal. No respiratory distress.     Breath sounds: Normal breath sounds. No stridor. No wheezing, rhonchi or rales.  Chest:     Chest wall: No tenderness.  Abdominal:     General: Abdomen is flat. Bowel sounds are normal. There is no distension.     Palpations: Abdomen is soft. There is no mass.     Tenderness: There is no abdominal tenderness. There is no right CVA tenderness, left CVA tenderness, guarding or rebound.     Hernia: No hernia is present.  Genitourinary:    Comments: Genital exam deferred with shared decision making Musculoskeletal:        General: No swelling,  tenderness, deformity or signs of injury.     Right lower leg: No edema.     Left lower leg: No edema.  Lymphadenopathy:     Cervical: No cervical adenopathy.  Skin:    General: Skin is warm and dry.     Capillary Refill: Capillary refill takes less than 2 seconds.     Coloration: Skin is not jaundiced or pale.     Findings: No bruising, erythema, lesion or rash.  Neurological:     General: No focal deficit present.     Mental Status: He is alert and oriented to person, place, and time.     Cranial Nerves: No cranial nerve deficit.     Sensory: No sensory deficit.     Motor: No weakness.     Coordination: Coordination normal.     Gait: Gait normal.     Deep Tendon Reflexes: Reflexes normal.  Psychiatric:        Mood and Affect: Mood normal.        Behavior: Behavior normal.        Thought Content: Thought content normal.        Judgment: Judgment normal.     Results for orders placed or performed in visit on 11/19/18  Novel Coronavirus, NAA ([facility])  Result Value Ref Range   SARS-CoV-2, NAA Detected (A) Not Detected      Assessment & Plan:   Problem List Items Addressed This Visit      Cardiovascular and Mediastinum   Hypertension    Under good control on current regimen. Continue current regimen. Continue to monitor. Call with any concerns. Refills given. Labs drawn today.        Relevant Medications   losartan-hydrochlorothiazide (HYZAAR) 100-12.5 MG tablet   atorvastatin (LIPITOR) 20 MG tablet   amLODipine (NORVASC) 10 MG tablet   Other Relevant Orders   CBC with Differential/Platelet   Comprehensive metabolic panel   Microalbumin, Urine Waived   TSH  Endocrine   DM type 2 with diabetic peripheral neuropathy (HCC)    Under good control with A1c of 6.2. Continue current regimen. Continue to monitor. Recheck 3 months. Refills given.       Relevant Medications   sitaGLIPtin (JANUVIA) 100 MG tablet   pregabalin (LYRICA) 75 MG capsule   metFORMIN  (GLUMETZA) 500 MG (MOD) 24 hr tablet   losartan-hydrochlorothiazide (HYZAAR) 100-12.5 MG tablet   atorvastatin (LIPITOR) 20 MG tablet   Other Relevant Orders   Bayer DCA Hb A1c Waived   CBC with Differential/Platelet   Comprehensive metabolic panel   Microalbumin, Urine Waived   TSH   UA/M w/rflx Culture, Routine   Ambulatory referral to Ophthalmology     Nervous and Auditory   Neuropathy, leg   Relevant Medications   pregabalin (LYRICA) 75 MG capsule     Genitourinary   BPH with obstruction/lower urinary tract symptoms    Follows with urology. Doing well. PSA checked today- will forward results.       Relevant Orders   CBC with Differential/Platelet   Comprehensive metabolic panel   PSA     Other   Hyperlipidemia    Under good control on current regimen. Continue current regimen. Continue to monitor. Call with any concerns. Refills given. Labs drawn today.       Relevant Medications   losartan-hydrochlorothiazide (HYZAAR) 100-12.5 MG tablet   atorvastatin (LIPITOR) 20 MG tablet   amLODipine (NORVASC) 10 MG tablet   Other Relevant Orders   CBC with Differential/Platelet   Lipid Panel w/o Chol/HDL Ratio    Other Visit Diagnoses    Routine general medical examination at a health care facility    -  Primary   Vaccines updated. Screening labs checked today. Colonoscopy up to date. Continue diet and exercise. Recheck 3 months.    Immunization due       Flu and pneumovax given today.   Relevant Orders   Flu Vaccine QUAD 6+ mos PF IM (Fluarix Quad PF) (Completed)      LABORATORY TESTING:  Health maintenance labs ordered today as discussed above.   The natural history of prostate cancer and ongoing controversy regarding screening and potential treatment outcomes of prostate cancer has been discussed with the patient. The meaning of a false positive PSA and a false negative PSA has been discussed. He indicates understanding of the limitations of this screening test and  wishes to proceed with screening PSA testing.   IMMUNIZATIONS:   - Tdap: Tetanus vaccination status reviewed: last tetanus booster within 10 years. - Influenza: Administered today - Pneumovax: Administered today - Prevnar: Not applicable  SCREENING: - Colonoscopy: Up to date  Discussed with patient purpose of the colonoscopy is to detect colon cancer at curable precancerous or early stages   PATIENT COUNSELING:    Sexuality: Discussed sexually transmitted diseases, partner selection, use of condoms, avoidance of unintended pregnancy  and contraceptive alternatives.   Advised to avoid cigarette smoking.  I discussed with the patient that most people either abstain from alcohol or drink within safe limits (<=14/week and <=4 drinks/occasion for males, <=7/weeks and <= 3 drinks/occasion for females) and that the risk for alcohol disorders and other health effects rises proportionally with the number of drinks per week and how often a drinker exceeds daily limits.  Discussed cessation/primary prevention of drug use and availability of treatment for abuse.   Diet: Encouraged to adjust caloric intake to maintain  or achieve ideal body weight, to reduce intake of  dietary saturated fat and total fat, to limit sodium intake by avoiding high sodium foods and not adding table salt, and to maintain adequate dietary potassium and calcium preferably from fresh fruits, vegetables, and low-fat dairy products.    stressed the importance of regular exercise  Injury prevention: Discussed safety belts, safety helmets, smoke detector, smoking near bedding or upholstery.   Dental health: Discussed importance of regular tooth brushing, flossing, and dental visits.   Follow up plan: NEXT PREVENTATIVE PHYSICAL DUE IN 1 YEAR. Return in about 3 months (around 05/16/2019) for follow up DM.

## 2019-02-14 LAB — COMPREHENSIVE METABOLIC PANEL
ALT: 23 IU/L (ref 0–44)
AST: 19 IU/L (ref 0–40)
Albumin/Globulin Ratio: 1.6 (ref 1.2–2.2)
Albumin: 4.5 g/dL (ref 3.8–4.9)
Alkaline Phosphatase: 71 IU/L (ref 39–117)
BUN/Creatinine Ratio: 13 (ref 9–20)
BUN: 14 mg/dL (ref 6–24)
Bilirubin Total: 0.4 mg/dL (ref 0.0–1.2)
CO2: 23 mmol/L (ref 20–29)
Calcium: 9.8 mg/dL (ref 8.7–10.2)
Chloride: 100 mmol/L (ref 96–106)
Creatinine, Ser: 1.07 mg/dL (ref 0.76–1.27)
GFR calc Af Amer: 87 mL/min/{1.73_m2} (ref >59)
GFR calc non Af Amer: 76 mL/min/{1.73_m2} (ref >59)
Globulin, Total: 2.9 g/dL (ref 1.5–4.5)
Glucose: 80 mg/dL (ref 65–99)
Potassium: 4.2 mmol/L (ref 3.5–5.2)
Sodium: 139 mmol/L (ref 134–144)
Total Protein: 7.4 g/dL (ref 6.0–8.5)

## 2019-02-14 LAB — CBC WITH DIFFERENTIAL/PLATELET
Basophils Absolute: 0.1 10*3/uL (ref 0.0–0.2)
Basos: 1 %
EOS (ABSOLUTE): 0.2 10*3/uL (ref 0.0–0.4)
Eos: 3 %
Hematocrit: 42.5 % (ref 37.5–51.0)
Hemoglobin: 14.6 g/dL (ref 13.0–17.7)
Immature Grans (Abs): 0 10*3/uL (ref 0.0–0.1)
Immature Granulocytes: 0 %
Lymphocytes Absolute: 2 10*3/uL (ref 0.7–3.1)
Lymphs: 27 %
MCH: 30 pg (ref 26.6–33.0)
MCHC: 34.4 g/dL (ref 31.5–35.7)
MCV: 87 fL (ref 79–97)
Monocytes Absolute: 0.7 10*3/uL (ref 0.1–0.9)
Monocytes: 9 %
Neutrophils Absolute: 4.4 10*3/uL (ref 1.4–7.0)
Neutrophils: 60 %
Platelets: 332 10*3/uL (ref 150–450)
RBC: 4.87 x10E6/uL (ref 4.14–5.80)
RDW: 13.3 % (ref 11.6–15.4)
WBC: 7.4 10*3/uL (ref 3.4–10.8)

## 2019-02-14 LAB — LIPID PANEL W/O CHOL/HDL RATIO
Cholesterol, Total: 120 mg/dL (ref 100–199)
HDL: 38 mg/dL — ABNORMAL LOW (ref >39)
LDL Chol Calc (NIH): 58 mg/dL (ref 0–99)
Triglycerides: 134 mg/dL (ref 0–149)
VLDL Cholesterol Cal: 24 mg/dL (ref 5–40)

## 2019-02-14 LAB — TSH: TSH: 1.2 u[IU]/mL (ref 0.450–4.500)

## 2019-02-14 LAB — PSA: Prostate Specific Ag, Serum: 2.8 ng/mL (ref 0.0–4.0)

## 2019-06-12 ENCOUNTER — Ambulatory Visit: Payer: BC Managed Care – PPO | Admitting: Family Medicine

## 2019-06-20 ENCOUNTER — Telehealth (INDEPENDENT_AMBULATORY_CARE_PROVIDER_SITE_OTHER): Payer: BC Managed Care – PPO | Admitting: Family Medicine

## 2019-06-20 ENCOUNTER — Ambulatory Visit: Payer: BC Managed Care – PPO | Admitting: Family Medicine

## 2019-06-20 ENCOUNTER — Encounter: Payer: Self-pay | Admitting: Family Medicine

## 2019-06-20 DIAGNOSIS — E1142 Type 2 diabetes mellitus with diabetic polyneuropathy: Secondary | ICD-10-CM | POA: Diagnosis not present

## 2019-06-20 LAB — BAYER DCA HB A1C WAIVED: HB A1C (BAYER DCA - WAIVED): 7.1 % — ABNORMAL HIGH (ref <7.0)

## 2019-06-20 NOTE — Progress Notes (Signed)
There were no vitals taken for this visit.   Subjective:    Patient ID: Shawn Shaw, male    DOB: [birth year], 59 y.o.   MRN: [id number]  HPI: Shawn Shaw is a 59 y.o. male  Chief Complaint  Patient presents with  . Diabetes   DIABETES- had 2 deaths in the family so has been feeling stressed and eating a bit more Hypoglycemic episodes:no Polydipsia/polyuria: no Visual disturbance: no Chest pain: no Paresthesias: yes Glucose Monitoring: yes  Accucheck frequency: occasionally  Fasting glucose: 170 yesterday Taking Insulin?: no Blood Pressure Monitoring: not checking Retinal Examination: Up to Date Foot Exam: Up to Date Diabetic Education: Completed Pneumovax: Up to Date Influenza: Up to Date Aspirin: yes  Relevant past medical, surgical, family and social history reviewed and updated as indicated. Interim medical history since our last visit reviewed. Allergies and medications reviewed and updated.  Review of Systems  Constitutional: Negative.   Respiratory: Negative.   Cardiovascular: Negative.   Musculoskeletal: Negative.   Neurological: Negative.   Psychiatric/Behavioral: Negative.     Per HPI unless specifically indicated above     Objective:    There were no vitals taken for this visit.  Wt Readings from Last 3 Encounters:  11/18/18 205 lb (93 kg)  03/12/18 211 lb (95.7 kg)  02/28/18 222 lb (100.7 kg)    Physical Exam Vitals and nursing note reviewed.  Constitutional:      General: He is not in acute distress.    Appearance: Normal appearance. He is not ill-appearing, toxic-appearing or diaphoretic.  HENT:     Head: Normocephalic and atraumatic.     Right Ear: External ear normal.     Left Ear: External ear normal.     Nose: Nose normal.     Mouth/Throat:     Mouth: Mucous membranes are moist.     Pharynx: Oropharynx is clear.  Eyes:     General: No scleral icterus.       Right eye: No discharge.        Left eye: No discharge.   Conjunctiva/sclera: Conjunctivae normal.     Pupils: Pupils are equal, round, and reactive to light.  Pulmonary:     Effort: Pulmonary effort is normal. No respiratory distress.     Comments: Speaking in full sentences Musculoskeletal:        General: Normal range of motion.     Cervical back: Normal range of motion.  Skin:    Coloration: Skin is not jaundiced or pale.     Findings: No bruising, erythema, lesion or rash.  Neurological:     Mental Status: He is alert and oriented to person, place, and time. Mental status is at baseline.  Psychiatric:        Mood and Affect: Mood normal.        Behavior: Behavior normal.        Thought Content: Thought content normal.        Judgment: Judgment normal.     Results for orders placed or performed in visit on 02/13/19  Bayer DCA Hb A1c Waived  Result Value Ref Range   HB A1C (BAYER DCA - WAIVED) 6.2 <7.0 %  CBC with Differential/Platelet  Result Value Ref Range   WBC 7.4 3.4 - 10.8 x10E3/uL   RBC 4.87 4.14 - 5.80 x10E6/uL   Hemoglobin 14.6 13.0 - 17.7 g/dL   Hematocrit 42.5 37.5 - 51.0 %   MCV 87 79 - 97 fL   MCH 30.0  26.6 - 33.0 pg   MCHC 34.4 31.5 - 35.7 g/dL   RDW 42.7 06.2 - 37.6 %   Platelets 332 150 - 450 x10E3/uL   Neutrophils 60 Not Estab. %   Lymphs 27 Not Estab. %   Monocytes 9 Not Estab. %   Eos 3 Not Estab. %   Basos 1 Not Estab. %   Neutrophils Absolute 4.4 1.4 - 7.0 x10E3/uL   Lymphocytes Absolute 2.0 0.7 - 3.1 x10E3/uL   Monocytes Absolute 0.7 0.1 - 0.9 x10E3/uL   EOS (ABSOLUTE) 0.2 0.0 - 0.4 x10E3/uL   Basophils Absolute 0.1 0.0 - 0.2 x10E3/uL   Immature Granulocytes 0 Not Estab. %   Immature Grans (Abs) 0.0 0.0 - 0.1 x10E3/uL  Comprehensive metabolic panel  Result Value Ref Range   Glucose 80 65 - 99 mg/dL   BUN 14 6 - 24 mg/dL   Creatinine, Ser 2.83 0.76 - 1.27 mg/dL   GFR calc non Af Amer 76 >59 mL/min/1.73   GFR calc Af Amer 87 >59 mL/min/1.73   BUN/Creatinine Ratio 13 9 - 20   Sodium 139 134 -  144 mmol/L   Potassium 4.2 3.5 - 5.2 mmol/L   Chloride 100 96 - 106 mmol/L   CO2 23 20 - 29 mmol/L   Calcium 9.8 8.7 - 10.2 mg/dL   Total Protein 7.4 6.0 - 8.5 g/dL   Albumin 4.5 3.8 - 4.9 g/dL   Globulin, Total 2.9 1.5 - 4.5 g/dL   Albumin/Globulin Ratio 1.6 1.2 - 2.2   Bilirubin Total 0.4 0.0 - 1.2 mg/dL   Alkaline Phosphatase 71 39 - 117 IU/L   AST 19 0 - 40 IU/L   ALT 23 0 - 44 IU/L  Lipid Panel w/o Chol/HDL Ratio  Result Value Ref Range   Cholesterol, Total 120 100 - 199 mg/dL   Triglycerides 151 0 - 149 mg/dL   HDL 38 (L) >76 mg/dL   VLDL Cholesterol Cal 24 5 - 40 mg/dL   LDL Chol Calc (NIH) 58 0 - 99 mg/dL  Microalbumin, Urine Waived  Result Value Ref Range   Microalb, Ur Waived 10 0 - 19 mg/L   Creatinine, Urine Waived 50 10 - 300 mg/dL   Microalb/Creat Ratio <30 <30 mg/g  PSA  Result Value Ref Range   Prostate Specific Ag, Serum 2.8 0.0 - 4.0 ng/mL  TSH  Result Value Ref Range   TSH 1.200 0.450 - 4.500 uIU/mL  UA/M w/rflx Culture, Routine   Specimen: Urine   URINE  Result Value Ref Range   Specific Gravity, UA 1.010 1.005 - 1.030   pH, UA 5.5 5.0 - 7.5   Color, UA Yellow Yellow   Appearance Ur Clear Clear   Leukocytes,UA Negative Negative   Protein,UA Negative Negative/Trace   Glucose, UA Negative Negative   Ketones, UA Negative Negative   RBC, UA Negative Negative   Bilirubin, UA Negative Negative   Urobilinogen, Ur 0.2 0.2 - 1.0 mg/dL   Nitrite, UA Negative Negative      Assessment & Plan:   Problem List Items Addressed This Visit      Endocrine   DM type 2 with diabetic peripheral neuropathy (HCC) - Primary    Has been doing well. Will come in today to get his A1c checked this afternoon. He is up to date on his refills- not due right now. Continue to monitor. Call with any concerns.       Relevant Orders   Bayer DCA Hb  A1c Waived       Follow up plan: No follow-ups on file.   . This visit was completed via mychart due to the restrictions  of the COVID-19 pandemic. All issues as above were discussed and addressed. Physical exam was done as above through visual confirmation on mychart. If it was felt that the patient should be evaluated in the office, they were directed there. The patient verbally consented to this visit. . Location of the patient: home . Location of the provider: work . Those involved with this call:  . Provider: Olevia Perches, DO . CMA: Tiffany Reel, CMA . Front Desk/Registration: Adela Ports  . Time spent on call: 15 minutes with patient face to face via video conference. More than 50% of this time was spent in counseling and coordination of care. 23 minutes total spent in review of patient's record and preparation of their chart.

## 2019-06-20 NOTE — Assessment & Plan Note (Signed)
Has been doing well. Will come in today to get his A1c checked this afternoon. He is up to date on his refills- not due right now. Continue to monitor. Call with any concerns.

## 2019-08-05 ENCOUNTER — Encounter: Payer: Self-pay | Admitting: Family Medicine

## 2019-09-19 ENCOUNTER — Other Ambulatory Visit: Payer: Self-pay | Admitting: Family Medicine

## 2019-09-19 DIAGNOSIS — G5793 Unspecified mononeuropathy of bilateral lower limbs: Secondary | ICD-10-CM

## 2019-09-19 NOTE — Telephone Encounter (Signed)
LOV: 02/13/2019; NOV: 09/26/2018.  Last filled 02/13/2019 for 90 days with 1 refill.   Called Pt and LVM requesting call back to see if he has enough medication until his appt.

## 2019-09-19 NOTE — Telephone Encounter (Signed)
Pt does not have enough medication to last until his appt 09/26/2019.

## 2019-09-19 NOTE — Telephone Encounter (Signed)
Requested medication (s) are due for refill today: yes  Requested medication (s) are on the active medication list: yes  Last refill:  06/22/19  Future visit scheduled: yes  Notes to clinic:  not delegated    Requested Prescriptions  Pending Prescriptions Disp Refills   pregabalin (LYRICA) 75 MG capsule [Pharmacy Med Name: PREGABALIN 75 MG CAPSULE] 180 capsule 0    Sig: Take 1 capsule (75 mg total) by mouth 2 (two) times daily.      Not Delegated - Neurology:  Anticonvulsants - Controlled Failed - 09/19/2019 10:04 AM      Failed - This refill cannot be delegated      Passed - Valid encounter within last 12 months    Recent Outpatient Visits           3 months ago DM type 2 with diabetic peripheral neuropathy (HCC)   [facility] [place], Megan P, DO   7 months ago Routine general medical examination at a health care facility   [facility], Megan P, DO   9 months ago PE (physical exam), annual   [facility] Steele Sizer, MD   10 months ago Exposure to Covid-19 Virus   [facility], [place], [place]   11 months ago Acute right-sided low back pain without sciatica   [facility] Particia Nearing, [place]       Future Appointments             In 1 week Laural Benes, Oralia Rud, DO [employer], [facility]

## 2019-09-26 ENCOUNTER — Ambulatory Visit (INDEPENDENT_AMBULATORY_CARE_PROVIDER_SITE_OTHER): Payer: BC Managed Care – PPO | Admitting: Family Medicine

## 2019-09-26 ENCOUNTER — Encounter: Payer: Self-pay | Admitting: Family Medicine

## 2019-09-26 ENCOUNTER — Other Ambulatory Visit: Payer: Self-pay

## 2019-09-26 VITALS — BP 150/82 | HR 56 | Temp 97.9°F | Ht 70.87 in | Wt 228.2 lb

## 2019-09-26 DIAGNOSIS — E785 Hyperlipidemia, unspecified: Secondary | ICD-10-CM

## 2019-09-26 DIAGNOSIS — F419 Anxiety disorder, unspecified: Secondary | ICD-10-CM

## 2019-09-26 DIAGNOSIS — I10 Essential (primary) hypertension: Secondary | ICD-10-CM | POA: Diagnosis not present

## 2019-09-26 DIAGNOSIS — G5793 Unspecified mononeuropathy of bilateral lower limbs: Secondary | ICD-10-CM

## 2019-09-26 DIAGNOSIS — E1142 Type 2 diabetes mellitus with diabetic polyneuropathy: Secondary | ICD-10-CM

## 2019-09-26 LAB — BAYER DCA HB A1C WAIVED: HB A1C (BAYER DCA - WAIVED): 7 % — ABNORMAL HIGH (ref <7.0)

## 2019-09-26 MED ORDER — SITAGLIPTIN PHOSPHATE 100 MG PO TABS: 100.0000 mg | ORAL_TABLET | Freq: Every day | ORAL | 1 refills | Status: DC

## 2019-09-26 MED ORDER — METFORMIN HCL ER 500 MG PO TB24: 1000.0000 mg | ORAL_TABLET | Freq: Two times a day (BID) | ORAL | 1 refills | Status: DC

## 2019-09-26 MED ORDER — ATORVASTATIN CALCIUM 20 MG PO TABS: 20.0000 mg | ORAL_TABLET | Freq: Every day | ORAL | 1 refills | Status: DC

## 2019-09-26 MED ORDER — AMLODIPINE BESYLATE 10 MG PO TABS: 10.0000 mg | ORAL_TABLET | Freq: Every day | ORAL | 1 refills | Status: DC

## 2019-09-26 MED ORDER — PREGABALIN 75 MG PO CAPS: 75.0000 mg | ORAL_CAPSULE | Freq: Two times a day (BID) | ORAL | 1 refills | Status: DC

## 2019-09-26 MED ORDER — LOSARTAN POTASSIUM-HCTZ 100-25 MG PO TABS
1.0000 | ORAL_TABLET | Freq: Every day | ORAL | 1 refills | Status: DC
Start: 2019-09-26 — End: 2020-03-19

## 2019-09-26 NOTE — Progress Notes (Signed)
BP (!) 150/82 (BP Location: Left Arm, Patient Position: Sitting, Cuff Size: Normal)   Pulse (!) 56   Temp 97.9 F (36.6 C) (Oral)   Ht 5' 10.87" (1.8 m)   Wt 228 lb 3.2 oz (103.5 kg)   SpO2 98%   BMI 31.95 kg/m    Subjective:    Patient ID: Shawn Shaw, male    DOB: Feb 18, 1961, 58 y.o.   MRN: [id number]  HPI: Shawn Shaw is a 59 y.o. male  Chief Complaint  Patient presents with  . Diabetes   HYPERTENSION / HYPERLIPIDEMIA Satisfied with current treatment? no Duration of hypertension: chronic BP monitoring frequency: not checking BP medication side effects: no Past BP meds: losartan HCTZ, amlodipine Duration of hyperlipidemia: chronic Cholesterol medication side effects: no Cholesterol supplements: none Past cholesterol medications: atorvastatin Medication compliance: excellent compliance Aspirin: no Recent stressors: no Recurrent headaches: no Visual changes: no Palpitations: no Dyspnea: no Chest pain: no Lower extremity edema: no Dizzy/lightheaded: no  DIABETES Hypoglycemic episodes:no Polydipsia/polyuria: no Visual disturbance: no Chest pain: no Paresthesias: no Glucose Monitoring: yes  Accucheck frequency: occasionally  Fasting glucose: "high' Taking Insulin?: no Blood Pressure Monitoring: rarely Retinal Examination: Up to Date Foot Exam: Up to Date Diabetic Education: Completed Pneumovax: Up to Date Influenza: Up to Date Aspirin: no  ANXIETY/STRESS Duration:stable Anxious mood: yes  Excessive worrying: no Irritability: no  Sweating: no Nausea: no Palpitations:no Hyperventilation: no Panic attacks: no Agoraphobia: no  Obscessions/compulsions: no Depressed mood: no Depression screen [facility] 2/9 09/26/2019 06/20/2019 02/13/2019 02/13/2019 08/23/2017  Decreased Interest 0 0 0 0 0  Down, Depressed, Hopeless 0 0 0 0 0  PHQ - 2 Score 0 0 0 0 0  Altered sleeping 1 0 0 - -  Tired, decreased energy 1 1 0 - -  Change in appetite 3 1 0 - -  Feeling  bad or failure about yourself  0 1 0 - -  Trouble concentrating 1 0 0 - -  Moving slowly or fidgety/restless 1 0 0 - -  Suicidal thoughts 0 0 0 - -  PHQ-9 Score 7 3 0 - -  Difficult doing work/chores Somewhat difficult Not difficult at all Not difficult at all - -   Anhedonia: no Weight changes: no Insomnia: no   Hypersomnia: no Fatigue/loss of energy: no Feelings of worthlessness: no Feelings of guilt: no Impaired concentration/indecisiveness: no Suicidal ideations: no  Crying spells: no Recent Stressors/Life Changes: no   Relationship problems: no   Family stress: no     Financial stress: no    Job stress: no    Recent death/loss: no  Relevant past medical, surgical, family and social history reviewed and updated as indicated. Interim medical history since our last visit reviewed. Allergies and medications reviewed and updated.  Review of Systems  Constitutional: Negative.   HENT: Negative.   Respiratory: Positive for shortness of breath. Negative for apnea, cough, choking, chest tightness, wheezing and stridor.   Cardiovascular: Negative.   Gastrointestinal: Negative.   Psychiatric/Behavioral: Negative.     Per HPI unless specifically indicated above     Objective:    BP (!) 150/82 (BP Location: Left Arm, Patient Position: Sitting, Cuff Size: Normal)   Pulse (!) 56   Temp 97.9 F (36.6 C) (Oral)   Ht 5' 10.87" (1.8 m)   Wt 228 lb 3.2 oz (103.5 kg)   SpO2 98%   BMI 31.95 kg/m   Wt Readings from Last 3 Encounters:  09/26/19 228 lb 3.2 oz (  103.5 kg)  11/18/18 205 lb (93 kg)  03/12/18 211 lb (95.7 kg)    Physical Exam Vitals and nursing note reviewed.  Constitutional:      General: He is not in acute distress.    Appearance: Normal appearance. He is not ill-appearing, toxic-appearing or diaphoretic.  HENT:     Head: Normocephalic and atraumatic.     Right Ear: External ear normal.     Left Ear: External ear normal.     Nose: Nose normal.      Mouth/Throat:     Mouth: Mucous membranes are moist.     Pharynx: Oropharynx is clear.  Eyes:     General: No scleral icterus.       Right eye: No discharge.        Left eye: No discharge.     Extraocular Movements: Extraocular movements intact.     Conjunctiva/sclera: Conjunctivae normal.     Pupils: Pupils are equal, round, and reactive to light.  Cardiovascular:     Rate and Rhythm: Normal rate and regular rhythm.     Pulses: Normal pulses.     Heart sounds: Normal heart sounds. No murmur. No friction rub. No gallop.   Pulmonary:     Effort: Pulmonary effort is normal. No respiratory distress.     Breath sounds: Normal breath sounds. No stridor. No wheezing, rhonchi or rales.  Chest:     Chest wall: No tenderness.  Musculoskeletal:        General: Normal range of motion.     Cervical back: Normal range of motion and neck supple.  Skin:    General: Skin is warm and dry.     Capillary Refill: Capillary refill takes less than 2 seconds.     Coloration: Skin is not jaundiced or pale.     Findings: No bruising, erythema, lesion or rash.  Neurological:     General: No focal deficit present.     Mental Status: He is alert and oriented to person, place, and time. Mental status is at baseline.  Psychiatric:        Mood and Affect: Mood normal.        Behavior: Behavior normal.        Thought Content: Thought content normal.        Judgment: Judgment normal.     Results for orders placed or performed in visit on 06/20/19  Bayer DCA Hb A1c Waived  Result Value Ref Range   HB A1C (BAYER DCA - WAIVED) 7.1 (H) <7.0 %      Assessment & Plan:   Problem List Items Addressed This Visit      Cardiovascular and Mediastinum   Hypertension    Running high. Will increase his HCTZ to 25mg  and continue his other medicine. Recheck 3 months. Call with any concerns.       Relevant Medications   sildenafil (REVATIO) 20 MG tablet   amLODipine (NORVASC) 10 MG tablet   atorvastatin  (LIPITOR) 20 MG tablet   losartan-hydrochlorothiazide (HYZAAR) 100-25 MG tablet   Other Relevant Orders   Comprehensive metabolic panel     Endocrine   DM type 2 with diabetic peripheral neuropathy (HCC) - Primary    Has been having diarrhea on the short acting metformin. Changed to long acting. A1c under good control at 7.0. Work on diet and exercise. Continue to monitor. Call with any concerns.       Relevant Medications   metFORMIN (GLUCOPHAGE-XR) 500 MG 24 hr tablet  atorvastatin (LIPITOR) 20 MG tablet   sitaGLIPtin (JANUVIA) 100 MG tablet   pregabalin (LYRICA) 75 MG capsule   losartan-hydrochlorothiazide (HYZAAR) 100-25 MG tablet   Other Relevant Orders   Bayer DCA Hb A1c Waived (STAT)   Comprehensive metabolic panel     Nervous and Auditory   Neuropathy, leg   Relevant Medications   pregabalin (LYRICA) 75 MG capsule     Other   Anxiety    Under good control on current regimen. Continue current regimen. Continue to monitor. Call with any concerns. Refills given. Labs drawn today.      Hyperlipidemia    Under good control on current regimen. Continue current regimen. Continue to monitor. Call with any concerns. Refills given. Labs drawn today.       Relevant Medications   sildenafil (REVATIO) 20 MG tablet   amLODipine (NORVASC) 10 MG tablet   atorvastatin (LIPITOR) 20 MG tablet   losartan-hydrochlorothiazide (HYZAAR) 100-25 MG tablet   Other Relevant Orders   Comprehensive metabolic panel   Lipid Panel w/o Chol/HDL Ratio       Follow up plan: Return in about 3 months (around 12/27/2019).

## 2019-09-26 NOTE — Assessment & Plan Note (Signed)
Has been having diarrhea on the short acting metformin. Changed to long acting. A1c under good control at 7.0. Work on diet and exercise. Continue to monitor. Call with any concerns.

## 2019-09-26 NOTE — Assessment & Plan Note (Signed)
Running high. Will increase his HCTZ to 25mg  and continue his other medicine. Recheck 3 months. Call with any concerns.

## 2019-09-26 NOTE — Assessment & Plan Note (Signed)
Under good control on current regimen. Continue current regimen. Continue to monitor. Call with any concerns. Refills given. Labs drawn today.   

## 2019-09-27 LAB — COMPREHENSIVE METABOLIC PANEL
ALT: 53 IU/L — ABNORMAL HIGH (ref 0–44)
AST: 39 IU/L (ref 0–40)
Albumin/Globulin Ratio: 1.7 (ref 1.2–2.2)
Albumin: 4.6 g/dL (ref 3.8–4.9)
Alkaline Phosphatase: 73 IU/L (ref 39–117)
BUN/Creatinine Ratio: 16 (ref 9–20)
BUN: 18 mg/dL (ref 6–24)
Bilirubin Total: 0.5 mg/dL (ref 0.0–1.2)
CO2: 23 mmol/L (ref 20–29)
Calcium: 9.8 mg/dL (ref 8.7–10.2)
Chloride: 102 mmol/L (ref 96–106)
Creatinine, Ser: 1.15 mg/dL (ref 0.76–1.27)
GFR calc Af Amer: 80 mL/min/{1.73_m2} (ref >59)
GFR calc non Af Amer: 69 mL/min/{1.73_m2} (ref >59)
Globulin, Total: 2.7 g/dL (ref 1.5–4.5)
Glucose: 179 mg/dL — ABNORMAL HIGH (ref 65–99)
Potassium: 4.3 mmol/L (ref 3.5–5.2)
Sodium: 140 mmol/L (ref 134–144)
Total Protein: 7.3 g/dL (ref 6.0–8.5)

## 2019-09-27 LAB — LIPID PANEL W/O CHOL/HDL RATIO
Cholesterol, Total: 107 mg/dL (ref 100–199)
HDL: 32 mg/dL — ABNORMAL LOW (ref >39)
LDL Chol Calc (NIH): 49 mg/dL (ref 0–99)
Triglycerides: 147 mg/dL (ref 0–149)
VLDL Cholesterol Cal: 26 mg/dL (ref 5–40)

## 2019-09-29 ENCOUNTER — Telehealth: Payer: Self-pay

## 2019-09-29 NOTE — Telephone Encounter (Signed)
Called patient to give lab results, he states that a different metformin was to be sent to [facility], when he went to pick it up, they told him that was the Metformin that he has been on, can you check this .

## 2019-09-29 NOTE — Telephone Encounter (Signed)
I sent the long acting metformin to his pharmacy. In the computer we had him previously on the short acting. If he was on the long acting already it may have been an error in the computer. I want him to take the long acting.

## 2019-09-29 NOTE — Telephone Encounter (Signed)
Patient notified

## 2020-01-02 ENCOUNTER — Encounter: Payer: Self-pay | Admitting: Family Medicine

## 2020-01-02 ENCOUNTER — Ambulatory Visit (INDEPENDENT_AMBULATORY_CARE_PROVIDER_SITE_OTHER): Payer: BC Managed Care – PPO | Admitting: Family Medicine

## 2020-01-02 ENCOUNTER — Other Ambulatory Visit: Payer: Self-pay

## 2020-01-02 VITALS — BP 128/77 | HR 58 | Temp 98.9°F | Wt 225.0 lb

## 2020-01-02 DIAGNOSIS — R42 Dizziness and giddiness: Secondary | ICD-10-CM | POA: Diagnosis not present

## 2020-01-02 DIAGNOSIS — E1142 Type 2 diabetes mellitus with diabetic polyneuropathy: Secondary | ICD-10-CM | POA: Diagnosis not present

## 2020-01-02 DIAGNOSIS — G5793 Unspecified mononeuropathy of bilateral lower limbs: Secondary | ICD-10-CM

## 2020-01-02 LAB — BAYER DCA HB A1C WAIVED: HB A1C (BAYER DCA - WAIVED): 7 % — ABNORMAL HIGH (ref <7.0)

## 2020-01-02 MED ORDER — PREGABALIN 150 MG PO CAPS: 150.0000 mg | ORAL_CAPSULE | Freq: Two times a day (BID) | ORAL | 1 refills | Status: DC

## 2020-01-02 NOTE — Progress Notes (Signed)
BP 128/77   Pulse (!) 58   Temp 98.9 F (37.2 C) (Oral)   Wt 225 lb (102.1 kg)   SpO2 95%   BMI 31.50 kg/m    Subjective:    Patient ID: Shawn Shaw, male    DOB: Nov 20, 1960, 59 y.o.   MRN: [id number]  HPI: Shawn Shaw is a 59 y.o. male  Chief Complaint  Patient presents with  . Diabetes   DIABETES Hypoglycemic episodes:no Polydipsia/polyuria: no Visual disturbance: no Chest pain: no Paresthesias: yes Glucose Monitoring: yes  Accucheck frequency: occasionally Taking Insulin?: no Blood Pressure Monitoring: not checking Retinal Examination: Up to Date Foot Exam: Up to Date Diabetic Education: Completed Pneumovax: Up to Date Influenza: Up to Date Aspirin: yes  Has been getting dizzy when he's out in the heat. No passing out. It's only happening when he's been in the heat a long time. He does not drink water. He only drinks diet coke.   NEUROPATHY-His legs are bothering him more. Neuropathy status: exacerbated  Satisfied with current treatment?: no Medication side effects: no Medication compliance:  good compliance Location: L leg Pain: yes Severity: moderate  Quality:  Numb and tingling Frequency: constant Bilateral: no Symmetric: no Numbness: yes Decreased sensation: yes Weakness: yes Context: worse  Relevant past medical, surgical, family and social history reviewed and updated as indicated. Interim medical history since our last visit reviewed. Allergies and medications reviewed and updated.  Review of Systems  Constitutional: Negative.   Respiratory: Negative.   Cardiovascular: Negative.   Gastrointestinal: Negative.   Skin: Negative.   Neurological: Positive for dizziness and numbness. Negative for tremors, seizures, syncope, facial asymmetry, speech difficulty, weakness, light-headedness and headaches.  Psychiatric/Behavioral: Negative.     Per HPI unless specifically indicated above     Objective:    BP 128/77   Pulse (!) 58   Temp  98.9 F (37.2 C) (Oral)   Wt 225 lb (102.1 kg)   SpO2 95%   BMI 31.50 kg/m   Wt Readings from Last 3 Encounters:  01/02/20 225 lb (102.1 kg)  09/26/19 228 lb 3.2 oz (103.5 kg)  11/18/18 205 lb (93 kg)    Orthostatic VS for the past 24 hrs (Last 3 readings):  BP- Lying Pulse- Lying BP- Sitting Pulse- Sitting BP- Standing at 0 minutes Pulse- Standing at 0 minutes  01/02/20 1138 129/71 103 132/79 103 128/68 103   Physical Exam Vitals and nursing note reviewed.  Constitutional:      General: He is not in acute distress.    Appearance: Normal appearance. He is not ill-appearing, toxic-appearing or diaphoretic.  HENT:     Head: Normocephalic and atraumatic.     Right Ear: External ear normal.     Left Ear: External ear normal.     Nose: Nose normal.     Mouth/Throat:     Mouth: Mucous membranes are moist.     Pharynx: Oropharynx is clear.  Eyes:     General: No scleral icterus.       Right eye: No discharge.        Left eye: No discharge.     Extraocular Movements: Extraocular movements intact.     Conjunctiva/sclera: Conjunctivae normal.     Pupils: Pupils are equal, round, and reactive to light.  Cardiovascular:     Rate and Rhythm: Normal rate and regular rhythm.     Pulses: Normal pulses.     Heart sounds: Normal heart sounds. No murmur heard.  No friction  rub. No gallop.   Pulmonary:     Effort: Pulmonary effort is normal. No respiratory distress.     Breath sounds: Normal breath sounds. No stridor. No wheezing, rhonchi or rales.  Chest:     Chest wall: No tenderness.  Musculoskeletal:        General: Normal range of motion.     Cervical back: Normal range of motion and neck supple.  Skin:    General: Skin is warm and dry.     Capillary Refill: Capillary refill takes less than 2 seconds.     Coloration: Skin is not jaundiced or pale.     Findings: No bruising, erythema, lesion or rash.  Neurological:     General: No focal deficit present.     Mental Status: He is  alert and oriented to person, place, and time. Mental status is at baseline.  Psychiatric:        Mood and Affect: Mood normal.        Behavior: Behavior normal.        Thought Content: Thought content normal.        Judgment: Judgment normal.     Results for orders placed or performed in visit on 09/26/19  Bayer DCA Hb A1c Waived (STAT)  Result Value Ref Range   HB A1C (BAYER DCA - WAIVED) 7.0 (H) <7.0 %  Comprehensive metabolic panel  Result Value Ref Range   Glucose 179 (H) 65 - 99 mg/dL   BUN 18 6 - 24 mg/dL   Creatinine, Ser 6.26 0.76 - 1.27 mg/dL   GFR calc non Af Amer 69 >59 mL/min/1.73   GFR calc Af Amer 80 >59 mL/min/1.73   BUN/Creatinine Ratio 16 9 - 20   Sodium 140 134 - 144 mmol/L   Potassium 4.3 3.5 - 5.2 mmol/L   Chloride 102 96 - 106 mmol/L   CO2 23 20 - 29 mmol/L   Calcium 9.8 8.7 - 10.2 mg/dL   Total Protein 7.3 6.0 - 8.5 g/dL   Albumin 4.6 3.8 - 4.9 g/dL   Globulin, Total 2.7 1.5 - 4.5 g/dL   Albumin/Globulin Ratio 1.7 1.2 - 2.2   Bilirubin Total 0.5 0.0 - 1.2 mg/dL   Alkaline Phosphatase 73 39 - 117 IU/L   AST 39 0 - 40 IU/L   ALT 53 (H) 0 - 44 IU/L  Lipid Panel w/o Chol/HDL Ratio  Result Value Ref Range   Cholesterol, Total 107 100 - 199 mg/dL   Triglycerides 948 0 - 149 mg/dL   HDL 32 (L) >54 mg/dL   VLDL Cholesterol Cal 26 5 - 40 mg/dL   LDL Chol Calc (NIH) 49 0 - 99 mg/dL      Assessment & Plan:   Problem List Items Addressed This Visit      Endocrine   DM type 2 with diabetic peripheral neuropathy (HCC) - Primary    Stable with A1c of 7.0. Continue current regimen. Call with any concerns.       Relevant Medications   pregabalin (LYRICA) 150 MG capsule   Other Relevant Orders   Bayer DCA Hb A1c Waived     Nervous and Auditory   Neuropathy, leg    Not doing well. Will increase lyrica to 150mg  and recheck 3 months. Call with any concerns.       Relevant Medications   pregabalin (LYRICA) 150 MG capsule    Other Visit Diagnoses     Dizziness       Likely  due to dehydration. Water not coke when in the heat. Call with any concerns.        Follow up plan: Return in about 3 months (around 04/03/2020) for Physical.

## 2020-01-02 NOTE — Assessment & Plan Note (Signed)
Stable with A1c of 7.0. Continue current regimen. Call with any concerns.

## 2020-01-02 NOTE — Assessment & Plan Note (Signed)
Not doing well. Will increase lyrica to 150mg  and recheck 3 months. Call with any concerns.

## 2020-03-03 ENCOUNTER — Encounter: Payer: Self-pay | Admitting: Family Medicine

## 2020-03-19 ENCOUNTER — Other Ambulatory Visit: Payer: Self-pay | Admitting: Family Medicine

## 2020-04-16 ENCOUNTER — Encounter: Payer: Self-pay | Admitting: Family Medicine

## 2020-04-16 ENCOUNTER — Ambulatory Visit (INDEPENDENT_AMBULATORY_CARE_PROVIDER_SITE_OTHER): Payer: BC Managed Care – PPO | Admitting: Family Medicine

## 2020-04-16 ENCOUNTER — Other Ambulatory Visit: Payer: Self-pay

## 2020-04-16 VITALS — BP 132/74 | HR 67 | Temp 98.3°F | Ht 70.87 in | Wt 228.6 lb

## 2020-04-16 DIAGNOSIS — N138 Other obstructive and reflux uropathy: Secondary | ICD-10-CM

## 2020-04-16 DIAGNOSIS — N401 Enlarged prostate with lower urinary tract symptoms: Secondary | ICD-10-CM | POA: Diagnosis not present

## 2020-04-16 DIAGNOSIS — I10 Essential (primary) hypertension: Secondary | ICD-10-CM

## 2020-04-16 DIAGNOSIS — Z23 Encounter for immunization: Secondary | ICD-10-CM | POA: Diagnosis not present

## 2020-04-16 DIAGNOSIS — Z0000 Encounter for general adult medical examination without abnormal findings: Secondary | ICD-10-CM | POA: Diagnosis not present

## 2020-04-16 DIAGNOSIS — E785 Hyperlipidemia, unspecified: Secondary | ICD-10-CM | POA: Diagnosis not present

## 2020-04-16 DIAGNOSIS — E1142 Type 2 diabetes mellitus with diabetic polyneuropathy: Secondary | ICD-10-CM | POA: Diagnosis not present

## 2020-04-16 LAB — MICROALBUMIN, URINE WAIVED
Creatinine, Urine Waived: 200 mg/dL (ref 10–300)
Microalb, Ur Waived: 30 mg/L — ABNORMAL HIGH (ref 0–19)
Microalb/Creat Ratio: <30 mg/g (ref <30)

## 2020-04-16 LAB — URINALYSIS, ROUTINE W REFLEX MICROSCOPIC
Bilirubin, UA: NEGATIVE
Leukocytes,UA: NEGATIVE
Nitrite, UA: NEGATIVE
Protein,UA: NEGATIVE
RBC, UA: NEGATIVE
Specific Gravity, UA: 1.025 (ref 1.005–1.030)
Urobilinogen, Ur: 0.2 mg/dL (ref 0.2–1.0)
pH, UA: 5.5 (ref 5.0–7.5)

## 2020-04-16 LAB — BAYER DCA HB A1C WAIVED: HB A1C (BAYER DCA - WAIVED): 6.8 % (ref <7.0)

## 2020-04-16 MED ORDER — SITAGLIPTIN PHOSPHATE 100 MG PO TABS: 100.0000 mg | ORAL_TABLET | Freq: Every day | ORAL | 1 refills | Status: DC

## 2020-04-16 MED ORDER — ATORVASTATIN CALCIUM 20 MG PO TABS: 20.0000 mg | ORAL_TABLET | Freq: Every day | ORAL | 1 refills | Status: DC

## 2020-04-16 MED ORDER — LOSARTAN POTASSIUM-HCTZ 100-25 MG PO TABS
1.0000 | ORAL_TABLET | Freq: Every day | ORAL | 1 refills | Status: DC
Start: 2020-04-16 — End: 2020-10-15

## 2020-04-16 MED ORDER — METFORMIN HCL ER 500 MG PO TB24
1000.0000 mg | ORAL_TABLET | Freq: Two times a day (BID) | ORAL | 1 refills | Status: DC
Start: 2020-04-16 — End: 2020-10-15

## 2020-04-16 MED ORDER — PREGABALIN 75 MG PO CAPS: ORAL_CAPSULE | ORAL | 1 refills | Status: DC

## 2020-04-16 MED ORDER — AMLODIPINE BESYLATE 10 MG PO TABS: 10.0000 mg | ORAL_TABLET | Freq: Every day | ORAL | 1 refills | Status: DC

## 2020-04-16 NOTE — Assessment & Plan Note (Signed)
Under good control on current regimen. Continue current regimen. Continue to monitor. Call with any concerns. Refills given. Labs drawn today.   

## 2020-04-16 NOTE — Assessment & Plan Note (Signed)
Doing well with A1c of 6.8. Continue current regimen. Continue to monitor. Call with any concerns. Refills given today. 

## 2020-04-16 NOTE — Patient Instructions (Signed)

## 2020-04-16 NOTE — Progress Notes (Signed)
BP 132/74   Pulse 67   Temp 98.3 F (36.8 C) (Oral)   Ht 5' 10.87" (1.8 m)   Wt 228 lb 9.6 oz (103.7 kg)   SpO2 98%   BMI 32.00 kg/m    Subjective:    Patient ID: Shawn Shaw, male    DOB: 1961-04-29, 59 y.o.   MRN: [id number]  HPI: Shawn Shaw is a 59 y.o. male presenting on 04/16/2020 for comprehensive medical examination. Current medical complaints include:  DIABETES Hypoglycemic episodes:no Polydipsia/polyuria: no Visual disturbance: no Chest pain: no Paresthesias: no Glucose Monitoring: yes  Accucheck frequency: occasionally Taking Insulin?: no Blood Pressure Monitoring: not checking Retinal Examination: Not up to Date Foot Exam: Up to Date Diabetic Education: Completed Pneumovax: Up to Date Influenza: Up to Date Aspirin: yes  HYPERTENSION / HYPERLIPIDEMIA Satisfied with current treatment? no Duration of hypertension: chronic BP monitoring frequency: not checking BP medication side effects: no Past BP meds: amlodipine, losartan-HCTZ Duration of hyperlipidemia: chronic Cholesterol medication side effects: no Cholesterol supplements: none Past cholesterol medications: atorvastatin Medication compliance: excellent compliance Aspirin: no Recent stressors: no Recurrent headaches: no Visual changes: no Palpitations: no Dyspnea: no Chest pain: no Lower extremity edema: no Dizzy/lightheaded: no   He currently lives with: wife Interim Problems from his last visit: no  Depression Screen done today and results listed below:  [facility] 04/16/2020 01/02/2020 09/26/2019 06/20/2019 02/13/2019  Decreased Interest 0 2 0 0 0  Down, Depressed, Hopeless 0 0 0 0 0  PHQ - 2 Score 0 2 0 0 0  Altered sleeping - 0 1 0 0  Tired, decreased energy - 3 1 1  0  Change in appetite - 3 3 1  0  Feeling bad or failure about yourself  - 2 0 1 0  Trouble concentrating - 3 1 0 0  Moving slowly or fidgety/restless - 3 1 0 0  Suicidal thoughts - 0 0 0 0  PHQ-9 Score -  16 7 3  0  Difficult doing work/chores - Somewhat difficult Somewhat difficult Not difficult at all Not difficult at all    Past Medical History:  Past Medical History:  Diagnosis Date  . Anxiety   . Depression   . Diabetes mellitus without complication (HCC)   . Hyperlipidemia   . Hypertension     Surgical History:  Past Surgical History:  Procedure Laterality Date  . PTERYGIUM EXCISION Right     Medications:  Current Outpatient Medications on File Prior to Visit  Medication Sig  . finasteride (PROSCAR) 5 MG tablet 5 mg daily.  . sildenafil (REVATIO) 20 MG tablet Take 20 mg by mouth daily as needed.  . triamcinolone cream (KENALOG) 0.1 % Apply 1 application topically 2 (two) times daily.   No current facility-administered medications on file prior to visit.    Allergies:  Allergies  Allergen Reactions  . Trulicity [Dulaglutide] Rash  . Lisinopril Cough    Social History:  Social History   Socioeconomic History  . Marital status: Unknown    Spouse name: Not on file  . Number of children: Not on file  . Years of education: Not on file  . Highest education level: Not on file  Occupational History  . Not on file  Tobacco Use  . Smoking status: Former Smoker    Types: Cigarettes    Quit date: 12/22/2008    Years since quitting: 11.3  . Smokeless tobacco: Never Used  Vaping Use  . Vaping Use: Never used  Substance and Sexual Activity  . Alcohol use: Not Currently    Alcohol/week: 0.0 standard drinks  . Drug use: No  . Sexual activity: Not on file  Other Topics Concern  . Not on file  Social History Narrative  . Not on file   Social Determinants of Health   Financial Resource Strain:   . Difficulty of Paying Living Expenses: Not on file  Food Insecurity:   . Worried About Programme researcher, broadcasting/film/video in the Last Year: Not on file  . Ran Out of Food in the Last Year: Not on file  Transportation Needs:   . Lack of Transportation (Medical): Not on file  . Lack  of Transportation (Non-Medical): Not on file  Physical Activity:   . Days of Exercise per Week: Not on file  . Minutes of Exercise per Session: Not on file  Stress:   . Feeling of Stress : Not on file  Social Connections:   . Frequency of Communication with Friends and Family: Not on file  . Frequency of Social Gatherings with Friends and Family: Not on file  . Attends Religious Services: Not on file  . Active Member of Clubs or Organizations: Not on file  . Attends Banker Meetings: Not on file  . Marital Status: Not on file  Intimate Partner Violence:   . Fear of Current or Ex-Partner: Not on file  . Emotionally Abused: Not on file  . Physically Abused: Not on file  . Sexually Abused: Not on file   Social History   Tobacco Use  Smoking Status Former Smoker  . Types: Cigarettes  . Quit date: 12/22/2008  . Years since quitting: 11.3  Smokeless Tobacco Never Used   Social History   Substance and Sexual Activity  Alcohol Use Not Currently  . Alcohol/week: 0.0 standard drinks    Family History:  Family History  Family history unknown: Yes    Past medical history, surgical history, medications, allergies, family history and social history reviewed with patient today and changes made to appropriate areas of the chart.   Review of Systems  Constitutional: Positive for diaphoresis. Negative for chills, fever, malaise/fatigue and weight loss.  HENT: Negative.   Eyes: Negative.   Respiratory: Positive for cough (since covid). Negative for hemoptysis, sputum production, shortness of breath and wheezing.   Cardiovascular: Negative.   Gastrointestinal: Positive for diarrhea and heartburn. Negative for abdominal pain, blood in stool, constipation, melena, nausea and vomiting.  Genitourinary: Negative.   Musculoskeletal: Negative.   Skin: Negative.   Neurological: Positive for tingling. Negative for dizziness, tremors, sensory change, speech change, focal weakness,  seizures, loss of consciousness, weakness and headaches.  Endo/Heme/Allergies: Positive for environmental allergies. Negative for polydipsia. Bruises/bleeds easily.  Psychiatric/Behavioral: Negative.     All other ROS negative except what is listed above and in the HPI.      Objective:    BP 132/74   Pulse 67   Temp 98.3 F (36.8 C) (Oral)   Ht 5' 10.87" (1.8 m)   Wt 228 lb 9.6 oz (103.7 kg)   SpO2 98%   BMI 32.00 kg/m   Wt Readings from Last 3 Encounters:  04/16/20 228 lb 9.6 oz (103.7 kg)  01/02/20 225 lb (102.1 kg)  09/26/19 228 lb 3.2 oz (103.5 kg)    Physical Exam Vitals and nursing note reviewed.  Constitutional:      General: He is not in acute distress.    Appearance: Normal appearance. He is obese.  He is not ill-appearing, toxic-appearing or diaphoretic.  HENT:     Head: Normocephalic and atraumatic.     Right Ear: Tympanic membrane, ear canal and external ear normal. There is no impacted cerumen.     Left Ear: Tympanic membrane, ear canal and external ear normal. There is no impacted cerumen.     Nose: Nose normal. No congestion or rhinorrhea.     Mouth/Throat:     Mouth: Mucous membranes are moist.     Pharynx: Oropharynx is clear. No oropharyngeal exudate or posterior oropharyngeal erythema.  Eyes:     General: No scleral icterus.       Right eye: No discharge.        Left eye: No discharge.     Extraocular Movements: Extraocular movements intact.     Conjunctiva/sclera: Conjunctivae normal.     Pupils: Pupils are equal, round, and reactive to light.  Neck:     Vascular: No carotid bruit.  Cardiovascular:     Rate and Rhythm: Normal rate and regular rhythm.     Pulses: Normal pulses.     Heart sounds: No murmur heard.  No friction rub. No gallop.   Pulmonary:     Effort: Pulmonary effort is normal. No respiratory distress.     Breath sounds: Normal breath sounds. No stridor. No wheezing, rhonchi or rales.  Chest:     Chest wall: No tenderness.    Abdominal:     General: Abdomen is flat. Bowel sounds are normal. There is no distension.     Palpations: Abdomen is soft. There is no mass.     Tenderness: There is no abdominal tenderness. There is no right CVA tenderness, left CVA tenderness, guarding or rebound.     Hernia: No hernia is present.  Genitourinary:    Comments: Genital exam deferred with shared decision making Musculoskeletal:        General: No swelling, tenderness, deformity or signs of injury.     Cervical back: Normal range of motion and neck supple. No rigidity. No muscular tenderness.     Right lower leg: No edema.     Left lower leg: No edema.  Lymphadenopathy:     Cervical: No cervical adenopathy.  Skin:    General: Skin is warm and dry.     Capillary Refill: Capillary refill takes less than 2 seconds.     Coloration: Skin is not jaundiced or pale.     Findings: No bruising, erythema, lesion or rash.  Neurological:     General: No focal deficit present.     Mental Status: He is alert and oriented to person, place, and time.     Cranial Nerves: No cranial nerve deficit.     Sensory: No sensory deficit.     Motor: No weakness.     Coordination: Coordination normal.     Gait: Gait normal.     Deep Tendon Reflexes: Reflexes normal.  Psychiatric:        Mood and Affect: Mood normal.        Behavior: Behavior normal.        Thought Content: Thought content normal.        Judgment: Judgment normal.     Results for orders placed or performed in visit on 01/02/20  Bayer DCA Hb A1c Waived  Result Value Ref Range   HB A1C (BAYER DCA - WAIVED) 7.0 (H) <7.0 %      Assessment & Plan:   Problem List Items Addressed This  Visit      Cardiovascular and Mediastinum   Hypertension    Under good control on current regimen. Continue current regimen. Continue to monitor. Call with any concerns. Refills given. Labs drawn today.       Relevant Medications   losartan-hydrochlorothiazide (HYZAAR) 100-25 MG tablet    atorvastatin (LIPITOR) 20 MG tablet   amLODipine (NORVASC) 10 MG tablet   Other Relevant Orders   CBC with Differential/Platelet   Comprehensive metabolic panel   Microalbumin, Urine Waived   TSH     Endocrine   DM type 2 with diabetic peripheral neuropathy (HCC)    Doing well with A1c of 6.8. Continue current regimen. Continue to monitor. Call with any concerns. Refills given today.      Relevant Medications   pregabalin (LYRICA) 75 MG capsule   sitaGLIPtin (JANUVIA) 100 MG tablet   metFORMIN (GLUCOPHAGE-XR) 500 MG 24 hr tablet   losartan-hydrochlorothiazide (HYZAAR) 100-25 MG tablet   atorvastatin (LIPITOR) 20 MG tablet   Other Relevant Orders   Bayer DCA Hb A1c Waived   CBC with Differential/Platelet   Comprehensive metabolic panel   Microalbumin, Urine Waived     Genitourinary   BPH with obstruction/lower urinary tract symptoms    Under good control on current regimen. Continue current regimen. Continue to monitor. Call with any concerns. Refills given. Labs drawn today.       Relevant Orders   CBC with Differential/Platelet   Comprehensive metabolic panel   PSA   Urinalysis, Routine w reflex microscopic     Other   Hyperlipidemia    Under good control on current regimen. Continue current regimen. Continue to monitor. Call with any concerns. Refills given. Labs drawn today.       Relevant Medications   losartan-hydrochlorothiazide (HYZAAR) 100-25 MG tablet   atorvastatin (LIPITOR) 20 MG tablet   amLODipine (NORVASC) 10 MG tablet   Other Relevant Orders   CBC with Differential/Platelet   Comprehensive metabolic panel   Lipid Panel w/o Chol/HDL Ratio    Other Visit Diagnoses    Routine general medical examination at a health care facility    -  Primary   Vaccines up to date. Screening labs checked today. Colonoscopy due next year. Continue diet and exercise. Call wiht any concerns.    Relevant Orders   Bayer DCA Hb A1c Waived   CBC with  Differential/Platelet   Comprehensive metabolic panel   Lipid Panel w/o Chol/HDL Ratio   Microalbumin, Urine Waived   PSA   TSH   Urinalysis, Routine w reflex microscopic   Essential hypertension       Relevant Medications   losartan-hydrochlorothiazide (HYZAAR) 100-25 MG tablet   atorvastatin (LIPITOR) 20 MG tablet   amLODipine (NORVASC) 10 MG tablet       Discussed aspirin prophylaxis for myocardial infarction prevention and decision was made to continue ASA  LABORATORY TESTING:  Health maintenance labs ordered today as discussed above.   The natural history of prostate cancer and ongoing controversy regarding screening and potential treatment outcomes of prostate cancer has been discussed with the patient. The meaning of a false positive PSA and a false negative PSA has been discussed. He indicates understanding of the limitations of this screening test and wishes to proceed with screening PSA testing.   IMMUNIZATIONS:   - Tdap: Tetanus vaccination status reviewed: last tetanus booster within 10 years. - Influenza: Administered today - Pneumovax: Up to date - Prevnar: Refused - COVID: Up to date  SCREENING: -  Colonoscopy: Up to date  Discussed with patient purpose of the colonoscopy is to detect colon cancer at curable precancerous or early stages    PATIENT COUNSELING:    Sexuality: Discussed sexually transmitted diseases, partner selection, use of condoms, avoidance of unintended pregnancy  and contraceptive alternatives.   Advised to avoid cigarette smoking.  I discussed with the patient that most people either abstain from alcohol or drink within safe limits (<=14/week and <=4 drinks/occasion for males, <=7/weeks and <= 3 drinks/occasion for females) and that the risk for alcohol disorders and other health effects rises proportionally with the number of drinks per week and how often a drinker exceeds daily limits.  Discussed cessation/primary prevention of drug use  and availability of treatment for abuse.   Diet: Encouraged to adjust caloric intake to maintain  or achieve ideal body weight, to reduce intake of dietary saturated fat and total fat, to limit sodium intake by avoiding high sodium foods and not adding table salt, and to maintain adequate dietary potassium and calcium preferably from fresh fruits, vegetables, and low-fat dairy products.    stressed the importance of regular exercise  Injury prevention: Discussed safety belts, safety helmets, smoke detector, smoking near bedding or upholstery.   Dental health: Discussed importance of regular tooth brushing, flossing, and dental visits.   Follow up plan: NEXT PREVENTATIVE PHYSICAL DUE IN 1 YEAR. Return in about 6 months (around 10/15/2020).

## 2020-04-17 LAB — CBC WITH DIFFERENTIAL/PLATELET
Basophils Absolute: 0.1 10*3/uL (ref 0.0–0.2)
Basos: 1 %
EOS (ABSOLUTE): 0.4 10*3/uL (ref 0.0–0.4)
Eos: 6 %
Hematocrit: 42.2 % (ref 37.5–51.0)
Hemoglobin: 14.3 g/dL (ref 13.0–17.7)
Immature Grans (Abs): 0 10*3/uL (ref 0.0–0.1)
Immature Granulocytes: 0 %
Lymphocytes Absolute: 1.3 10*3/uL (ref 0.7–3.1)
Lymphs: 20 %
MCH: 29.7 pg (ref 26.6–33.0)
MCHC: 33.9 g/dL (ref 31.5–35.7)
MCV: 88 fL (ref 79–97)
Monocytes Absolute: 0.5 10*3/uL (ref 0.1–0.9)
Monocytes: 7 %
Neutrophils Absolute: 4.5 10*3/uL (ref 1.4–7.0)
Neutrophils: 66 %
Platelets: 321 10*3/uL (ref 150–450)
RBC: 4.82 x10E6/uL (ref 4.14–5.80)
RDW: 12.9 % (ref 11.6–15.4)
WBC: 6.8 10*3/uL (ref 3.4–10.8)

## 2020-04-17 LAB — COMPREHENSIVE METABOLIC PANEL
ALT: 35 IU/L (ref 0–44)
AST: 24 IU/L (ref 0–40)
Albumin/Globulin Ratio: 1.5 (ref 1.2–2.2)
Albumin: 4.1 g/dL (ref 3.8–4.9)
Alkaline Phosphatase: 74 IU/L (ref 44–121)
BUN/Creatinine Ratio: 14 (ref 10–24)
BUN: 15 mg/dL (ref 8–27)
Bilirubin Total: 0.4 mg/dL (ref 0.0–1.2)
CO2: 20 mmol/L (ref 20–29)
Calcium: 9.5 mg/dL (ref 8.6–10.2)
Chloride: 103 mmol/L (ref 96–106)
Creatinine, Ser: 1.11 mg/dL (ref 0.76–1.27)
GFR calc Af Amer: 83 mL/min/{1.73_m2} (ref >59)
GFR calc non Af Amer: 72 mL/min/{1.73_m2} (ref >59)
Globulin, Total: 2.8 g/dL (ref 1.5–4.5)
Glucose: 228 mg/dL — ABNORMAL HIGH (ref 65–99)
Potassium: 4.2 mmol/L (ref 3.5–5.2)
Sodium: 139 mmol/L (ref 134–144)
Total Protein: 6.9 g/dL (ref 6.0–8.5)

## 2020-04-17 LAB — LIPID PANEL W/O CHOL/HDL RATIO
Cholesterol, Total: 119 mg/dL (ref 100–199)
HDL: 33 mg/dL — ABNORMAL LOW (ref >39)
LDL Chol Calc (NIH): 60 mg/dL (ref 0–99)
Triglycerides: 152 mg/dL — ABNORMAL HIGH (ref 0–149)
VLDL Cholesterol Cal: 26 mg/dL (ref 5–40)

## 2020-04-17 LAB — PSA: Prostate Specific Ag, Serum: 4.1 ng/mL — ABNORMAL HIGH (ref 0.0–4.0)

## 2020-04-17 LAB — TSH: TSH: 1.17 u[IU]/mL (ref 0.450–4.500)

## 2020-04-19 ENCOUNTER — Other Ambulatory Visit: Payer: Self-pay | Admitting: Family Medicine

## 2020-04-19 DIAGNOSIS — R972 Elevated prostate specific antigen [PSA]: Secondary | ICD-10-CM

## 2020-05-21 ENCOUNTER — Other Ambulatory Visit: Payer: Self-pay

## 2020-05-21 ENCOUNTER — Other Ambulatory Visit: Payer: BC Managed Care – PPO

## 2020-05-21 DIAGNOSIS — R972 Elevated prostate specific antigen [PSA]: Secondary | ICD-10-CM

## 2020-05-22 LAB — PSA: Prostate Specific Ag, Serum: 2.6 ng/mL (ref 0.0–4.0)

## 2020-10-15 ENCOUNTER — Encounter: Payer: Self-pay | Admitting: Family Medicine

## 2020-10-15 ENCOUNTER — Ambulatory Visit: Payer: BC Managed Care – PPO | Admitting: Family Medicine

## 2020-10-15 ENCOUNTER — Other Ambulatory Visit: Payer: Self-pay

## 2020-10-15 VITALS — BP 139/84 | HR 52 | Temp 98.0°F | Ht 70.47 in | Wt 222.0 lb

## 2020-10-15 DIAGNOSIS — I10 Essential (primary) hypertension: Secondary | ICD-10-CM

## 2020-10-15 DIAGNOSIS — E785 Hyperlipidemia, unspecified: Secondary | ICD-10-CM | POA: Diagnosis not present

## 2020-10-15 DIAGNOSIS — N401 Enlarged prostate with lower urinary tract symptoms: Secondary | ICD-10-CM

## 2020-10-15 DIAGNOSIS — G5793 Unspecified mononeuropathy of bilateral lower limbs: Secondary | ICD-10-CM

## 2020-10-15 DIAGNOSIS — N138 Other obstructive and reflux uropathy: Secondary | ICD-10-CM

## 2020-10-15 DIAGNOSIS — E1142 Type 2 diabetes mellitus with diabetic polyneuropathy: Secondary | ICD-10-CM

## 2020-10-15 LAB — BAYER DCA HB A1C WAIVED: HB A1C (BAYER DCA - WAIVED): 6.8 % (ref <7.0)

## 2020-10-15 MED ORDER — AMLODIPINE BESYLATE 10 MG PO TABS: 10.0000 mg | ORAL_TABLET | Freq: Every day | ORAL | 1 refills | Status: DC

## 2020-10-15 MED ORDER — PREGABALIN 75 MG PO CAPS: ORAL_CAPSULE | ORAL | 1 refills | Status: DC

## 2020-10-15 MED ORDER — TRIAMCINOLONE ACETONIDE 0.1 % EX CREA: 1.0000 "application " | TOPICAL_CREAM | Freq: Two times a day (BID) | CUTANEOUS | 3 refills | Status: DC

## 2020-10-15 MED ORDER — ATORVASTATIN CALCIUM 20 MG PO TABS: 20.0000 mg | ORAL_TABLET | Freq: Every day | ORAL | 1 refills | Status: DC

## 2020-10-15 MED ORDER — LOSARTAN POTASSIUM-HCTZ 100-25 MG PO TABS: 1.0000 | ORAL_TABLET | Freq: Every day | ORAL | 1 refills | Status: DC

## 2020-10-15 MED ORDER — SITAGLIPTIN PHOSPHATE 100 MG PO TABS: 100.0000 mg | ORAL_TABLET | Freq: Every day | ORAL | 1 refills | Status: DC

## 2020-10-15 MED ORDER — METFORMIN HCL ER 500 MG PO TB24: 1000.0000 mg | ORAL_TABLET | Freq: Two times a day (BID) | ORAL | 1 refills | Status: DC

## 2020-10-15 NOTE — Assessment & Plan Note (Signed)
Doing well with A1c of 6.8. Continue current regimen. Continue to monitor. Call with any concerns. Refills given today.

## 2020-10-15 NOTE — Assessment & Plan Note (Signed)
Under good control on current regimen. Continue current regimen. Continue to monitor. Call with any concerns. Refills given. Labs drawn today.   

## 2020-10-15 NOTE — Assessment & Plan Note (Signed)
Under good control on current regimen. Continue current regimen. Continue to monitor. Call with any concerns. Refills given.   

## 2020-10-15 NOTE — Progress Notes (Signed)
BP 139/84   Pulse (!) 52   Temp 98 F (36.7 C) (Oral)   Ht 5' 10.47" (1.79 m)   Wt 222 lb (100.7 kg)   SpO2 96%   BMI 31.43 kg/m    Subjective:    Patient ID: Shawn Shaw, male    DOB: [birth year], 60 y.o.   MRN: [id number]  HPI: Shawn Shaw is a 60 y.o. male  Chief Complaint  Patient presents with  . Diabetes  . Hypertension   HYPERTENSION / HYPERLIPIDEMIA Satisfied with current treatment? yes Duration of hypertension: chronic BP monitoring frequency: not checking BP medication side effects: no Past BP meds: amlodipine, losartan, HCTZ Duration of hyperlipidemia: chronic Cholesterol medication side effects: no Cholesterol supplements: none Past cholesterol medications: atorvastatin Medication compliance: excellent compliance Aspirin: no Recent stressors: no Recurrent headaches: no Visual changes: no Palpitations: no Dyspnea: no Chest pain: no Lower extremity edema: no Dizzy/lightheaded: no  DIABETES Hypoglycemic episodes:no Polydipsia/polyuria: no Visual disturbance: no Chest pain: no Paresthesias: no Glucose Monitoring: no  Accucheck frequency: Not Checking Taking Insulin?: no Blood Pressure Monitoring: not checking Retinal Examination: Not up to Date Foot Exam: Up to Date Diabetic Education: Completed Pneumovax: Up to Date Influenza: Up to Date Aspirin: yes  BPH BPH status: controlled Satisfied with current treatment?: yes Medication side effects: no Medication compliance: excellent compliance Duration: chronic Nocturia: 1/night Urinary frequency:no Incomplete voiding: no Urgency: no Weak urinary stream: no Straining to start stream: no Dysuria: no Onset: gradual Severity: mild   Relevant past medical, surgical, family and social history reviewed and updated as indicated. Interim medical history since our last visit reviewed. Allergies and medications reviewed and updated.  Review of Systems  Constitutional: Negative.    Respiratory: Negative.   Cardiovascular: Negative.   Gastrointestinal: Negative.   Musculoskeletal: Negative.   Psychiatric/Behavioral: Negative.        Grief- recently lost him mom    Per HPI unless specifically indicated above     Objective:    BP 139/84   Pulse (!) 52   Temp 98 F (36.7 C) (Oral)   Ht 5' 10.47" (1.79 m)   Wt 222 lb (100.7 kg)   SpO2 96%   BMI 31.43 kg/m   Wt Readings from Last 3 Encounters:  10/15/20 222 lb (100.7 kg)  04/16/20 228 lb 9.6 oz (103.7 kg)  01/02/20 225 lb (102.1 kg)    Physical Exam Vitals and nursing note reviewed.  Constitutional:      General: He is not in acute distress.    Appearance: Normal appearance. He is not ill-appearing, toxic-appearing or diaphoretic.  HENT:     Head: Normocephalic and atraumatic.     Right Ear: External ear normal.     Left Ear: External ear normal.     Nose: Nose normal.     Mouth/Throat:     Mouth: Mucous membranes are moist.     Pharynx: Oropharynx is clear.  Eyes:     General: No scleral icterus.       Right eye: No discharge.        Left eye: No discharge.     Extraocular Movements: Extraocular movements intact.     Conjunctiva/sclera: Conjunctivae normal.     Pupils: Pupils are equal, round, and reactive to light.  Cardiovascular:     Rate and Rhythm: Normal rate and regular rhythm.     Pulses: Normal pulses.     Heart sounds: Normal heart sounds. No murmur heard. No friction rub. No gallop.  Pulmonary:     Effort: Pulmonary effort is normal. No respiratory distress.     Breath sounds: Normal breath sounds. No stridor. No wheezing, rhonchi or rales.  Chest:     Chest wall: No tenderness.  Musculoskeletal:        General: Normal range of motion.     Cervical back: Normal range of motion and neck supple.  Skin:    General: Skin is warm and dry.     Capillary Refill: Capillary refill takes less than 2 seconds.     Coloration: Skin is not jaundiced or pale.     Findings: No bruising,  erythema, lesion or rash.  Neurological:     General: No focal deficit present.     Mental Status: He is alert and oriented to person, place, and time. Mental status is at baseline.  Psychiatric:        Mood and Affect: Mood normal.        Behavior: Behavior normal.        Thought Content: Thought content normal.        Judgment: Judgment normal.     Results for orders placed or performed in visit on 05/21/20  PSA  Result Value Ref Range   Prostate Specific Ag, Serum 2.6 0.0 - 4.0 ng/mL      Assessment & Plan:   Problem List Items Addressed This Visit      Cardiovascular and Mediastinum   Hypertension    Under good control on current regimen. Continue current regimen. Continue to monitor. Call with any concerns. Refills given. Labs drawn today.       Relevant Medications   amLODipine (NORVASC) 10 MG tablet   atorvastatin (LIPITOR) 20 MG tablet   losartan-hydrochlorothiazide (HYZAAR) 100-25 MG tablet   Other Relevant Orders   CBC with Differential/Platelet   Comprehensive metabolic panel     Endocrine   DM type 2 with diabetic peripheral neuropathy (HCC) - Primary    Doing well with A1c of 6.8. Continue current regimen. Continue to monitor. Call with any concerns. Refills given today.      Relevant Medications   atorvastatin (LIPITOR) 20 MG tablet   losartan-hydrochlorothiazide (HYZAAR) 100-25 MG tablet   metFORMIN (GLUCOPHAGE-XR) 500 MG 24 hr tablet   pregabalin (LYRICA) 75 MG capsule   sitaGLIPtin (JANUVIA) 100 MG tablet   Other Relevant Orders   Bayer DCA Hb A1c Waived   CBC with Differential/Platelet   Comprehensive metabolic panel     Nervous and Auditory   Neuropathy, leg    Under good control on current regimen. Continue current regimen. Continue to monitor. Call with any concerns. Refills given.        Relevant Medications   pregabalin (LYRICA) 75 MG capsule     Genitourinary   BPH with obstruction/lower urinary tract symptoms    Continue to  follow with urology as needed. Under good control on current regimen. Continue current regimen. Continue to monitor. Call with any concerns. Refills through urology. Labs drawn today.        Relevant Orders   CBC with Differential/Platelet   Comprehensive metabolic panel   PSA     Other   Hyperlipidemia    Under good control on current regimen. Continue current regimen. Continue to monitor. Call with any concerns. Refills given. Labs drawn today.       Relevant Medications   amLODipine (NORVASC) 10 MG tablet   atorvastatin (LIPITOR) 20 MG tablet   losartan-hydrochlorothiazide (HYZAAR) 100-25 MG tablet  Other Relevant Orders   CBC with Differential/Platelet   Comprehensive metabolic panel   Lipid Panel w/o Chol/HDL Ratio    Other Visit Diagnoses    Essential hypertension       Relevant Medications   amLODipine (NORVASC) 10 MG tablet   atorvastatin (LIPITOR) 20 MG tablet   losartan-hydrochlorothiazide (HYZAAR) 100-25 MG tablet       Follow up plan: Return in about 6 months (around 04/16/2021) for physical.

## 2020-10-15 NOTE — Assessment & Plan Note (Signed)
Continue to follow with urology as needed. Under good control on current regimen. Continue current regimen. Continue to monitor. Call with any concerns. Refills through urology. Labs drawn today.

## 2020-10-16 LAB — COMPREHENSIVE METABOLIC PANEL
ALT: 22 IU/L (ref 0–44)
AST: 17 IU/L (ref 0–40)
Albumin/Globulin Ratio: 1.6 (ref 1.2–2.2)
Albumin: 4.1 g/dL (ref 3.8–4.9)
Alkaline Phosphatase: 63 IU/L (ref 44–121)
BUN/Creatinine Ratio: 17 (ref 10–24)
BUN: 18 mg/dL (ref 8–27)
Bilirubin Total: 0.5 mg/dL (ref 0.0–1.2)
CO2: 21 mmol/L (ref 20–29)
Calcium: 9.4 mg/dL (ref 8.6–10.2)
Chloride: 102 mmol/L (ref 96–106)
Creatinine, Ser: 1.04 mg/dL (ref 0.76–1.27)
Globulin, Total: 2.6 g/dL (ref 1.5–4.5)
Glucose: 116 mg/dL — ABNORMAL HIGH (ref 65–99)
Potassium: 3.9 mmol/L (ref 3.5–5.2)
Sodium: 138 mmol/L (ref 134–144)
Total Protein: 6.7 g/dL (ref 6.0–8.5)
eGFR: 82 mL/min/{1.73_m2} (ref >59)

## 2020-10-16 LAB — CBC WITH DIFFERENTIAL/PLATELET
Basophils Absolute: 0.1 10*3/uL (ref 0.0–0.2)
Basos: 1 %
EOS (ABSOLUTE): 0.3 10*3/uL (ref 0.0–0.4)
Eos: 4 %
Hematocrit: 41 % (ref 37.5–51.0)
Hemoglobin: 13.3 g/dL (ref 13.0–17.7)
Immature Grans (Abs): 0 10*3/uL (ref 0.0–0.1)
Immature Granulocytes: 0 %
Lymphocytes Absolute: 1.5 10*3/uL (ref 0.7–3.1)
Lymphs: 20 %
MCH: 28.6 pg (ref 26.6–33.0)
MCHC: 32.4 g/dL (ref 31.5–35.7)
MCV: 88 fL (ref 79–97)
Monocytes Absolute: 0.5 10*3/uL (ref 0.1–0.9)
Monocytes: 7 %
Neutrophils Absolute: 5.1 10*3/uL (ref 1.4–7.0)
Neutrophils: 68 %
Platelets: 288 10*3/uL (ref 150–450)
RBC: 4.65 x10E6/uL (ref 4.14–5.80)
RDW: 13.4 % (ref 11.6–15.4)
WBC: 7.5 10*3/uL (ref 3.4–10.8)

## 2020-10-16 LAB — PSA: Prostate Specific Ag, Serum: 3.2 ng/mL (ref 0.0–4.0)

## 2020-10-16 LAB — LIPID PANEL W/O CHOL/HDL RATIO
Cholesterol, Total: 101 mg/dL (ref 100–199)
HDL: 33 mg/dL — ABNORMAL LOW (ref >39)
LDL Chol Calc (NIH): 47 mg/dL (ref 0–99)
Triglycerides: 117 mg/dL (ref 0–149)
VLDL Cholesterol Cal: 21 mg/dL (ref 5–40)

## 2020-12-09 ENCOUNTER — Other Ambulatory Visit: Payer: Self-pay | Admitting: Family Medicine

## 2020-12-09 NOTE — Telephone Encounter (Signed)
Requested medication (s) are due for refill today: yes   Requested medication (s) are on the active medication list: yes    Future visit scheduled: yes  Notes to clinic:  this refill cannot be delegated    Requested Prescriptions  Pending Prescriptions Disp Refills   pregabalin (LYRICA) 75 MG capsule [Pharmacy Med Name: PREGABALIN 75 MG CAPSULE] 270 capsule 0    Sig: Take 1 in the AM and 2 in the PM      Not Delegated - Neurology:  Anticonvulsants - Controlled Failed - 12/09/2020  1:26 PM      Failed - This refill cannot be delegated      Passed - Valid encounter within last 12 months    Recent Outpatient Visits           1 month ago DM type 2 with diabetic peripheral neuropathy (HCC)   [facility] [place], Megan P, DO   7 months ago Routine general medical examination at a health care facility   [facility], Megan P, DO   11 months ago DM type 2 with diabetic peripheral neuropathy [facility])   [facility] [place], Megan P, DO   1 year ago DM type 2 with diabetic peripheral neuropathy [facility])   [facility] [place], Megan P, DO   1 year ago DM type 2 with diabetic peripheral neuropathy [facility])   [facility] [place], Megan P, DO       Future Appointments             In 4 months Johnson, Oralia Rud, DO [employer] [facility]

## 2020-12-09 NOTE — Telephone Encounter (Signed)
Next apt on 04/22/2021

## 2020-12-12 NOTE — Telephone Encounter (Signed)
6 month supply sent in in June- please make sure they have it at the pharmacy

## 2020-12-13 NOTE — Telephone Encounter (Signed)
Spoke with Shawna Orleans from [employer] and informed that back in June, patient picked up a prescription with the quantity of 270 capsules and was notified that patient is not in need of any medication. Patient states he did not realize when he went and picked up his prescription that he received 2 bottles and did not realized. Patient states that he has taken half of his prescription, but he is good and has another bottle waiting. Patient was advised to give our office a call back if he has any questions to arise.

## 2021-04-22 ENCOUNTER — Other Ambulatory Visit: Payer: Self-pay

## 2021-04-22 ENCOUNTER — Ambulatory Visit (INDEPENDENT_AMBULATORY_CARE_PROVIDER_SITE_OTHER): Payer: BC Managed Care – PPO | Admitting: Family Medicine

## 2021-04-22 ENCOUNTER — Encounter: Payer: Self-pay | Admitting: Family Medicine

## 2021-04-22 ENCOUNTER — Other Ambulatory Visit: Payer: Self-pay | Admitting: Family Medicine

## 2021-04-22 VITALS — BP 144/75 | HR 67 | Temp 97.9°F | Ht 71.0 in | Wt 223.2 lb

## 2021-04-22 DIAGNOSIS — N401 Enlarged prostate with lower urinary tract symptoms: Secondary | ICD-10-CM | POA: Diagnosis not present

## 2021-04-22 DIAGNOSIS — N138 Other obstructive and reflux uropathy: Secondary | ICD-10-CM

## 2021-04-22 DIAGNOSIS — Z0000 Encounter for general adult medical examination without abnormal findings: Secondary | ICD-10-CM | POA: Diagnosis not present

## 2021-04-22 DIAGNOSIS — E1142 Type 2 diabetes mellitus with diabetic polyneuropathy: Secondary | ICD-10-CM | POA: Diagnosis not present

## 2021-04-22 DIAGNOSIS — E785 Hyperlipidemia, unspecified: Secondary | ICD-10-CM | POA: Diagnosis not present

## 2021-04-22 DIAGNOSIS — Z1211 Encounter for screening for malignant neoplasm of colon: Secondary | ICD-10-CM

## 2021-04-22 DIAGNOSIS — Z23 Encounter for immunization: Secondary | ICD-10-CM | POA: Diagnosis not present

## 2021-04-22 DIAGNOSIS — I10 Essential (primary) hypertension: Secondary | ICD-10-CM

## 2021-04-22 DIAGNOSIS — D692 Other nonthrombocytopenic purpura: Secondary | ICD-10-CM

## 2021-04-22 LAB — MICROALBUMIN, URINE WAIVED
Creatinine, Urine Waived: 200 mg/dL (ref 10–300)
Microalb, Ur Waived: 10 mg/L (ref 0–19)
Microalb/Creat Ratio: <30 mg/g (ref <30)

## 2021-04-22 LAB — URINALYSIS, ROUTINE W REFLEX MICROSCOPIC
Bilirubin, UA: NEGATIVE
Glucose, UA: NEGATIVE
Ketones, UA: NEGATIVE
Leukocytes,UA: NEGATIVE
Nitrite, UA: NEGATIVE
Protein,UA: NEGATIVE
RBC, UA: NEGATIVE
Specific Gravity, UA: 1.02 (ref 1.005–1.030)
Urobilinogen, Ur: 0.2 mg/dL (ref 0.2–1.0)
pH, UA: 5.5 (ref 5.0–7.5)

## 2021-04-22 LAB — BAYER DCA HB A1C WAIVED: HB A1C (BAYER DCA - WAIVED): 6.8 % — ABNORMAL HIGH (ref 4.8–5.6)

## 2021-04-22 MED ORDER — AMLODIPINE BESYLATE 10 MG PO TABS: 10.0000 mg | ORAL_TABLET | Freq: Every day | ORAL | 1 refills | Status: DC

## 2021-04-22 MED ORDER — PREGABALIN 75 MG PO CAPS: ORAL_CAPSULE | ORAL | 1 refills | Status: DC

## 2021-04-22 MED ORDER — METFORMIN HCL ER 500 MG PO TB24
1000.0000 mg | ORAL_TABLET | Freq: Two times a day (BID) | ORAL | 1 refills | Status: DC
Start: 2021-04-22 — End: 2021-07-22

## 2021-04-22 MED ORDER — LOSARTAN POTASSIUM-HCTZ 100-25 MG PO TABS
1.0000 | ORAL_TABLET | Freq: Every day | ORAL | 1 refills | Status: DC
Start: 2021-04-22 — End: 2021-07-22

## 2021-04-22 MED ORDER — SITAGLIPTIN PHOSPHATE 100 MG PO TABS: 100.0000 mg | ORAL_TABLET | Freq: Every day | ORAL | 1 refills | Status: DC

## 2021-04-22 MED ORDER — TRIAMCINOLONE ACETONIDE 0.1 % EX CREA: 1.0000 "application " | TOPICAL_CREAM | Freq: Two times a day (BID) | CUTANEOUS | 3 refills | Status: DC

## 2021-04-22 MED ORDER — ATORVASTATIN CALCIUM 20 MG PO TABS: 20.0000 mg | ORAL_TABLET | Freq: Every day | ORAL | 1 refills | Status: DC

## 2021-04-22 NOTE — Assessment & Plan Note (Signed)
Under good control on current regimen. Continue current regimen. Continue to monitor. Call with any concerns. Refills given. Labs drawn today.   

## 2021-04-22 NOTE — Telephone Encounter (Signed)
Provider has already approved Rx request.   Requested Prescriptions  Refused Prescriptions Disp Refills  . pregabalin (LYRICA) 75 MG capsule [Pharmacy Med Name: PREGABALIN 75 MG CAPSULE] 270 capsule 0    Sig: Take 1 in the MORNING and 2 in the [facility]     Not Delegated - Neurology:  Anticonvulsants - Controlled Failed - 04/22/2021  9:29 AM      Failed - This refill cannot be delegated      Passed - Valid encounter within last 12 months    Recent Outpatient Visits          Today Routine general medical examination at a health care facility   [facility], Megan P, DO   6 months ago DM type 2 with diabetic peripheral neuropathy [facility])   [facility] [place], Megan P, DO   1 year ago Routine general medical examination at a health care facility   [facility], [place] P, DO   1 year ago DM type 2 with diabetic peripheral neuropathy [facility])   [facility], Megan P, DO   1 year ago DM type 2 with diabetic peripheral neuropathy [facility])   [facility] [place], Oralia Rud, DO      Future Appointments            In 3 months Johnson, Oralia Rud, DO [facility]

## 2021-04-22 NOTE — Assessment & Plan Note (Signed)
Running a little high. Will work on [employer] and recheck in 3 months. Call with any concerns.

## 2021-04-22 NOTE — Assessment & Plan Note (Signed)
Doing well with A1c of 6.8. Continue diet and exercise. Continue medication. Call with any concerns. Refills given.

## 2021-04-22 NOTE — Assessment & Plan Note (Signed)
Reassured patient. Continue to monitor.  

## 2021-04-22 NOTE — Progress Notes (Signed)
BP (!) 144/75   Pulse 67   Temp 97.9 F (36.6 C)   Ht $R'5\' 11"'CI$  (1.803 m)   Wt 223 lb 3.2 oz (101.2 kg)   SpO2 97%   BMI 31.13 kg/m    Subjective:    Patient ID: Shawn Shaw, male    DOB: August 26, 1960, 60 y.o.   MRN: [id number]  HPI: Shawn Shaw is a 60 y.o. male presenting on 04/22/2021 for comprehensive medical examination. Current medical complaints include:  DIABETES Hypoglycemic episodes:no Polydipsia/polyuria: no Visual disturbance: no Chest pain: no Paresthesias: no Glucose Monitoring: no  Accucheck frequency: Not Checking Taking Insulin?: no Blood Pressure Monitoring: not checking Retinal Examination: Not up to Date Foot Exam: Up to Date Diabetic Education: Completed Pneumovax: Up to Date Influenza: Up to Date Aspirin: no  HYPERTENSION / [place] Satisfied with current treatment? yes Duration of hypertension: chronic BP monitoring frequency: not checking BP medication side effects: no Past BP meds: amlodipine, losartan-HCTZ Duration of hyperlipidemia: chronic Cholesterol medication side effects: no Cholesterol supplements: none Past cholesterol medications: atorvastatin Medication compliance: excellent compliance Aspirin: no Recent stressors: no Recurrent headaches: no Visual changes: no Palpitations: no Dyspnea: no Chest pain: no Lower extremity edema: no Dizzy/lightheaded: no  He currently lives with: wife Interim Problems from his last visit: no  Depression Screen done today and results listed below:  Depression screen [facility] 2/9 04/22/2021 10/15/2020 04/16/2020 01/02/2020 09/26/2019  Decreased Interest 0 0 0 2 0  Down, Depressed, Hopeless 0 1 0 0 0  PHQ - 2 Score 0 1 0 2 0  Altered sleeping - 0 - 0 1  Tired, decreased energy - 3 - 3 1  Change in appetite - 1 - 3 3  Feeling bad or failure about yourself  - 0 - 2 0  Trouble concentrating - 0 - 3 1  Moving slowly or fidgety/restless - 0 - 3 1  Suicidal thoughts - 0 - 0 0  PHQ-9 Score - 5 -  16 7  Difficult doing work/chores - Not difficult at all - Somewhat difficult Somewhat difficult    Past Medical History:  Past Medical History:  Diagnosis Date   Anxiety    Depression    Diabetes mellitus without complication ([place])    Hyperlipidemia    Hypertension     Surgical History:  Past Surgical History:  Procedure Laterality Date   PTERYGIUM EXCISION Right     Medications:  Current Outpatient Medications on File Prior to Visit  Medication Sig   finasteride (PROSCAR) 5 MG tablet 5 mg daily.   sildenafil (REVATIO) 20 MG tablet Take 20 mg by mouth daily as needed.   No current facility-administered medications on file prior to visit.    Allergies:  Allergies  Allergen Reactions   Trulicity [Dulaglutide] Rash   Lisinopril Cough    Social History:  Social History   Socioeconomic History   Marital status: Unknown    Spouse name: Not on file   Number of children: Not on file   Years of education: Not on file   Highest education level: Not on file  Occupational History   Not on file  Tobacco Use   Smoking status: Former    Types: Cigarettes    Quit date: 12/22/2008    Years since quitting: 12.3   Smokeless tobacco: Never  Vaping Use   Vaping Use: Never used  Substance and Sexual Activity   Alcohol use: Not Currently    Alcohol/week: 0.0 standard drinks   Drug  use: No   Sexual activity: Not Currently  Other Topics Concern   Not on file  Social History Narrative   Not on file   Social Determinants of Health   Financial Resource Strain: Not on file  Food Insecurity: Not on file  Transportation Needs: Not on file  Physical Activity: Not on file  Stress: Not on file  Social Connections: Not on file  Intimate Partner Violence: Not on file   Social History   Tobacco Use  Smoking Status Former   Types: Cigarettes   Quit date: 12/22/2008   Years since quitting: 12.3  Smokeless Tobacco Never   Social History   Substance and Sexual Activity   Alcohol Use Not Currently   Alcohol/week: 0.0 standard drinks    Family History:  Family History  Family history unknown: Yes    Past medical history, surgical history, medications, allergies, family history and social history reviewed with patient today and changes made to appropriate areas of the chart.   Review of Systems  Constitutional: Negative.   HENT: Negative.         Dry ears  Eyes: Negative.   Respiratory: Negative.    Cardiovascular: Negative.   Gastrointestinal:  Positive for diarrhea and heartburn. Negative for abdominal pain, blood in stool, constipation, melena, nausea and vomiting.  Genitourinary: Negative.   Musculoskeletal: Negative.   Skin: Negative.   Neurological: Negative.   Endo/Heme/Allergies:  Negative for environmental allergies and polydipsia. Bruises/bleeds easily.  Psychiatric/Behavioral: Negative.    All other ROS negative except what is listed above and in the HPI.      Objective:    BP (!) 144/75   Pulse 67   Temp 97.9 F (36.6 C)   Ht 5\' 11"  (1.803 m)   Wt 223 lb 3.2 oz (101.2 kg)   SpO2 97%   BMI 31.13 kg/m   Wt Readings from Last 3 Encounters:  04/22/21 223 lb 3.2 oz (101.2 kg)  10/15/20 222 lb (100.7 kg)  04/16/20 228 lb 9.6 oz (103.7 kg)    Physical Exam Vitals and nursing note reviewed.  Constitutional:      General: He is not in acute distress.    Appearance: Normal appearance. He is obese. He is not ill-appearing, toxic-appearing or diaphoretic.  HENT:     Head: Normocephalic and atraumatic.     Right Ear: Tympanic membrane, ear canal and external ear normal. There is no impacted cerumen.     Left Ear: Tympanic membrane, ear canal and external ear normal. There is no impacted cerumen.     Nose: Nose normal. No congestion or rhinorrhea.     Mouth/Throat:     Mouth: Mucous membranes are moist.     Pharynx: Oropharynx is clear. No oropharyngeal exudate or posterior oropharyngeal erythema.  Eyes:     General: No scleral  icterus.       Right eye: No discharge.        Left eye: No discharge.     Extraocular Movements: Extraocular movements intact.     Conjunctiva/sclera: Conjunctivae normal.     Pupils: Pupils are equal, round, and reactive to light.  Neck:     Vascular: No carotid bruit.  Cardiovascular:     Rate and Rhythm: Normal rate and regular rhythm.     Pulses: Normal pulses.     Heart sounds: No murmur heard.   No friction rub. No gallop.  Pulmonary:     Effort: Pulmonary effort is normal. No respiratory distress.  Breath sounds: Normal breath sounds. No stridor. No wheezing, rhonchi or rales.  Chest:     Chest wall: No tenderness.  Abdominal:     General: Abdomen is flat. Bowel sounds are normal. There is no distension.     Palpations: Abdomen is soft. There is no mass.     Tenderness: There is no abdominal tenderness. There is no right CVA tenderness, left CVA tenderness, guarding or rebound.     Hernia: No hernia is present.  Genitourinary:    Comments: Genital exam deferred with shared decision making Musculoskeletal:        General: No swelling, tenderness, deformity or signs of injury.     Cervical back: Normal range of motion and neck supple. No rigidity. No muscular tenderness.     Right lower leg: No edema.     Left lower leg: No edema.  Lymphadenopathy:     Cervical: No cervical adenopathy.  Skin:    General: Skin is warm and dry.     Capillary Refill: Capillary refill takes less than 2 seconds.     Coloration: Skin is not jaundiced or pale.     Findings: No bruising, erythema, lesion or rash.  Neurological:     General: No focal deficit present.     Mental Status: He is alert and oriented to person, place, and time.     Cranial Nerves: No cranial nerve deficit.     Sensory: No sensory deficit.     Motor: No weakness.     Coordination: Coordination normal.     Gait: Gait normal.     Deep Tendon Reflexes: Reflexes normal.  Psychiatric:        Mood and Affect: Mood  normal.        Behavior: Behavior normal.        Thought Content: Thought content normal.        Judgment: Judgment normal.    Results for orders placed or performed in visit on 10/15/20  Bayer DCA Hb A1c Waived  Result Value Ref Range   HB A1C (BAYER DCA - WAIVED) 6.8 <7.0 %  CBC with Differential/Platelet  Result Value Ref Range   WBC 7.5 3.4 - 10.8 x10E3/uL   RBC 4.65 4.14 - 5.80 x10E6/uL   Hemoglobin 13.3 13.0 - 17.7 g/dL   Hematocrit 88.2 13.7 - 51.0 %   MCV 88 79 - 97 fL   MCH 28.6 26.6 - 33.0 pg   MCHC 32.4 31.5 - 35.7 g/dL   RDW 86.2 71.6 - 74.9 %   Platelets 288 150 - 450 x10E3/uL   Neutrophils 68 Not Estab. %   Lymphs 20 Not Estab. %   Monocytes 7 Not Estab. %   Eos 4 Not Estab. %   Basos 1 Not Estab. %   Neutrophils Absolute 5.1 1.4 - 7.0 x10E3/uL   Lymphocytes Absolute 1.5 0.7 - 3.1 x10E3/uL   Monocytes Absolute 0.5 0.1 - 0.9 x10E3/uL   EOS (ABSOLUTE) 0.3 0.0 - 0.4 x10E3/uL   Basophils Absolute 0.1 0.0 - 0.2 x10E3/uL   Immature Granulocytes 0 Not Estab. %   Immature Grans (Abs) 0.0 0.0 - 0.1 x10E3/uL  Comprehensive metabolic panel  Result Value Ref Range   Glucose 116 (H) 65 - 99 mg/dL   BUN 18 8 - 27 mg/dL   Creatinine, Ser 4.56 0.76 - 1.27 mg/dL   eGFR 82 >41 ME/CME/9.70   BUN/Creatinine Ratio 17 10 - 24   Sodium 138 134 - 144 mmol/L  Potassium 3.9 3.5 - 5.2 mmol/L   Chloride 102 96 - 106 mmol/L   CO2 21 20 - 29 mmol/L   Calcium 9.4 8.6 - 10.2 mg/dL   Total Protein 6.7 6.0 - 8.5 g/dL   Albumin 4.1 3.8 - 4.9 g/dL   Globulin, Total 2.6 1.5 - 4.5 g/dL   Albumin/Globulin Ratio 1.6 1.2 - 2.2   Bilirubin Total 0.5 0.0 - 1.2 mg/dL   Alkaline Phosphatase 63 44 - 121 IU/L   AST 17 0 - 40 IU/L   ALT 22 0 - 44 IU/L  Lipid Panel w/o Chol/HDL Ratio  Result Value Ref Range   Cholesterol, Total 101 100 - 199 mg/dL   Triglycerides 117 0 - 149 mg/dL   HDL 33 (L) >39 mg/dL   VLDL Cholesterol Cal 21 5 - 40 mg/dL   LDL Chol Calc (NIH) 47 0 - 99 mg/dL  PSA   Result Value Ref Range   Prostate Specific Ag, Serum 3.2 0.0 - 4.0 ng/mL      Assessment & Plan:   Problem List Items Addressed This Visit       Cardiovascular and Mediastinum   Hypertension    Running a little high. Will work on [employer] and recheck in 3 months. Call with any concerns.       Relevant Medications   amLODipine (NORVASC) 10 MG tablet   atorvastatin (LIPITOR) 20 MG tablet   losartan-hydrochlorothiazide (HYZAAR) 100-25 MG tablet   Other Relevant Orders   Comprehensive metabolic panel   CBC with Differential/Platelet   Lipid Panel w/o Chol/HDL Ratio   PSA   TSH   Urinalysis, Routine w reflex microscopic   Bayer DCA Hb A1c Waived   Microalbumin, Urine Waived   Senile purpura ([place])    Reassured patient. Continue to monitor.       Relevant Medications   amLODipine (NORVASC) 10 MG tablet   atorvastatin (LIPITOR) 20 MG tablet   losartan-hydrochlorothiazide (HYZAAR) 100-25 MG tablet     Endocrine   DM type 2 with diabetic peripheral neuropathy ([place])    Doing well with A1c of 6.8. Continue diet and exercise. Continue medication. Call with any concerns. Refills given.       Relevant Medications   atorvastatin (LIPITOR) 20 MG tablet   losartan-hydrochlorothiazide (HYZAAR) 100-25 MG tablet   metFORMIN (GLUCOPHAGE-XR) 500 MG 24 hr tablet   pregabalin (LYRICA) 75 MG capsule   sitaGLIPtin (JANUVIA) 100 MG tablet   Other Relevant Orders   Comprehensive metabolic panel   CBC with Differential/Platelet   TSH   Urinalysis, Routine w reflex microscopic   Bayer DCA Hb A1c Waived   Microalbumin, Urine Waived     Genitourinary   BPH with obstruction/lower urinary tract symptoms    Under good control on current regimen. Continue current regimen. Continue to monitor. Call with any concerns. Refills given. Labs drawn today       Relevant Orders   Comprehensive metabolic panel   CBC with Differential/Platelet   PSA     Other   Hyperlipidemia    Under good  control on current regimen. Continue current regimen. Continue to monitor. Call with any concerns. Refills given. Labs drawn today.        Relevant Medications   amLODipine (NORVASC) 10 MG tablet   atorvastatin (LIPITOR) 20 MG tablet   losartan-hydrochlorothiazide (HYZAAR) 100-25 MG tablet   Other Relevant Orders   Comprehensive metabolic panel   CBC with Differential/Platelet   Lipid Panel w/o Chol/HDL  Ratio   Other Visit Diagnoses     Routine general medical examination at a health care facility    -  Primary   Vaccines up to date. Screening labs checked today. Colonoscopy ordered today. Continue diet and exercise. Call with any concerns.    Screening for colon cancer       Referral to GI made today.   Relevant Orders   Ambulatory referral to Gastroenterology   Essential hypertension       Relevant Medications   amLODipine (NORVASC) 10 MG tablet   atorvastatin (LIPITOR) 20 MG tablet   losartan-hydrochlorothiazide (HYZAAR) 100-25 MG tablet   Need for influenza vaccination       Flu shot given today.   Relevant Orders   Flu Vaccine QUAD 6+ mos PF IM (Fluarix Quad PF) (Completed)        LABORATORY TESTING:  Health maintenance labs ordered today as discussed above.   The natural history of prostate cancer and ongoing controversy regarding screening and potential treatment outcomes of prostate cancer has been discussed with the patient. The meaning of a false positive PSA and a false negative PSA has been discussed. He indicates understanding of the limitations of this screening test and wishes to proceed with screening PSA testing.   IMMUNIZATIONS:   - Tdap: Tetanus vaccination status reviewed: last tetanus booster within 10 years. - Influenza: Up to date - Pneumovax: Up to date - Prevnar: Not applicable - COVID: Up to date - HPV: Not applicable - Shingrix vaccine:  Will get next time  SCREENING: - Colonoscopy: Ordered today  Discussed with patient purpose of the  colonoscopy is to detect colon cancer at curable precancerous or early stages   PATIENT COUNSELING:    Sexuality: Discussed sexually transmitted diseases, partner selection, use of condoms, avoidance of unintended pregnancy  and contraceptive alternatives.   Advised to avoid cigarette smoking.  I discussed with the patient that most people either abstain from alcohol or drink within safe limits (<=14/week and <=4 drinks/occasion for males, <=7/weeks and <= 3 drinks/occasion for females) and that the risk for alcohol disorders and other health effects rises proportionally with the number of drinks per week and how often a drinker exceeds daily limits.  Discussed cessation/primary prevention of drug use and availability of treatment for abuse.   Diet: Encouraged to adjust caloric intake to maintain  or achieve ideal body weight, to reduce intake of dietary saturated fat and total fat, to limit sodium intake by avoiding high sodium foods and not adding table salt, and to maintain adequate dietary potassium and calcium preferably from fresh fruits, vegetables, and low-fat dairy products.    stressed the importance of regular exercise  Injury prevention: Discussed safety belts, safety helmets, smoke detector, smoking near bedding or upholstery.   Dental health: Discussed importance of regular tooth brushing, flossing, and dental visits.   Follow up plan: NEXT PREVENTATIVE PHYSICAL DUE IN 1 YEAR. Return in about 3 months (around 07/21/2021).

## 2021-04-23 LAB — COMPREHENSIVE METABOLIC PANEL
ALT: 28 IU/L (ref 0–44)
AST: 21 IU/L (ref 0–40)
Albumin/Globulin Ratio: 1.6 (ref 1.2–2.2)
Albumin: 4.3 g/dL (ref 3.8–4.8)
Alkaline Phosphatase: 69 IU/L (ref 44–121)
BUN/Creatinine Ratio: 13 (ref 10–24)
BUN: 15 mg/dL (ref 8–27)
Bilirubin Total: 0.4 mg/dL (ref 0.0–1.2)
CO2: 22 mmol/L (ref 20–29)
Calcium: 9.5 mg/dL (ref 8.6–10.2)
Chloride: 103 mmol/L (ref 96–106)
Creatinine, Ser: 1.15 mg/dL (ref 0.76–1.27)
Globulin, Total: 2.7 g/dL (ref 1.5–4.5)
Glucose: 137 mg/dL — ABNORMAL HIGH (ref 70–99)
Potassium: 4.3 mmol/L (ref 3.5–5.2)
Sodium: 139 mmol/L (ref 134–144)
Total Protein: 7 g/dL (ref 6.0–8.5)
eGFR: 72 mL/min/{1.73_m2} (ref >59)

## 2021-04-23 LAB — CBC WITH DIFFERENTIAL/PLATELET
Basophils Absolute: 0.1 10*3/uL (ref 0.0–0.2)
Basos: 1 %
EOS (ABSOLUTE): 0.3 10*3/uL (ref 0.0–0.4)
Eos: 4 %
Hematocrit: 41.5 % (ref 37.5–51.0)
Hemoglobin: 14.5 g/dL (ref 13.0–17.7)
Immature Grans (Abs): 0 10*3/uL (ref 0.0–0.1)
Immature Granulocytes: 0 %
Lymphocytes Absolute: 1.6 10*3/uL (ref 0.7–3.1)
Lymphs: 24 %
MCH: 30.1 pg (ref 26.6–33.0)
MCHC: 34.9 g/dL (ref 31.5–35.7)
MCV: 86 fL (ref 79–97)
Monocytes Absolute: 0.4 10*3/uL (ref 0.1–0.9)
Monocytes: 6 %
Neutrophils Absolute: 4.3 10*3/uL (ref 1.4–7.0)
Neutrophils: 65 %
Platelets: 312 10*3/uL (ref 150–450)
RBC: 4.82 x10E6/uL (ref 4.14–5.80)
RDW: 12.8 % (ref 11.6–15.4)
WBC: 6.8 10*3/uL (ref 3.4–10.8)

## 2021-04-23 LAB — LIPID PANEL W/O CHOL/HDL RATIO
Cholesterol, Total: 103 mg/dL (ref 100–199)
HDL: 34 mg/dL — ABNORMAL LOW (ref >39)
LDL Chol Calc (NIH): 48 mg/dL (ref 0–99)
Triglycerides: 115 mg/dL (ref 0–149)
VLDL Cholesterol Cal: 21 mg/dL (ref 5–40)

## 2021-04-23 LAB — PSA: Prostate Specific Ag, Serum: 2.6 ng/mL (ref 0.0–4.0)

## 2021-04-23 LAB — TSH: TSH: 1.13 u[IU]/mL (ref 0.450–4.500)

## 2021-07-22 ENCOUNTER — Ambulatory Visit: Payer: BC Managed Care – PPO | Admitting: Family Medicine

## 2021-07-22 ENCOUNTER — Other Ambulatory Visit: Payer: Self-pay

## 2021-07-22 ENCOUNTER — Encounter: Payer: Self-pay | Admitting: Family Medicine

## 2021-07-22 VITALS — BP 136/79 | HR 56 | Temp 97.9°F | Wt 222.8 lb

## 2021-07-22 DIAGNOSIS — Z1211 Encounter for screening for malignant neoplasm of colon: Secondary | ICD-10-CM | POA: Diagnosis not present

## 2021-07-22 DIAGNOSIS — E785 Hyperlipidemia, unspecified: Secondary | ICD-10-CM

## 2021-07-22 DIAGNOSIS — I10 Essential (primary) hypertension: Secondary | ICD-10-CM

## 2021-07-22 DIAGNOSIS — E1142 Type 2 diabetes mellitus with diabetic polyneuropathy: Secondary | ICD-10-CM

## 2021-07-22 LAB — BAYER DCA HB A1C WAIVED: HB A1C (BAYER DCA - WAIVED): 6.6 % — ABNORMAL HIGH (ref 4.8–5.6)

## 2021-07-22 MED ORDER — PREGABALIN 75 MG PO CAPS: ORAL_CAPSULE | ORAL | 0 refills | Status: DC

## 2021-07-22 MED ORDER — LOSARTAN POTASSIUM-HCTZ 100-25 MG PO TABS: 1.0000 | ORAL_TABLET | Freq: Every day | ORAL | 0 refills | Status: DC

## 2021-07-22 MED ORDER — ATORVASTATIN CALCIUM 20 MG PO TABS: 20.0000 mg | ORAL_TABLET | Freq: Every day | ORAL | 0 refills | Status: DC

## 2021-07-22 MED ORDER — AMLODIPINE BESYLATE 10 MG PO TABS: 10.0000 mg | ORAL_TABLET | Freq: Every day | ORAL | 0 refills | Status: DC

## 2021-07-22 MED ORDER — SITAGLIPTIN PHOSPHATE 100 MG PO TABS: 100.0000 mg | ORAL_TABLET | Freq: Every day | ORAL | 0 refills | Status: DC

## 2021-07-22 MED ORDER — METFORMIN HCL ER 500 MG PO TB24: 1000.0000 mg | ORAL_TABLET | Freq: Two times a day (BID) | ORAL | 0 refills | Status: DC

## 2021-07-22 NOTE — Assessment & Plan Note (Signed)
Doing well with A1c of 6.6. Continue current regimen. Call with any concerns. Continue to monitor.  ?

## 2021-07-22 NOTE — Progress Notes (Signed)
? ?BP 136/79   Pulse (!) 56   Temp 97.9 ?F (36.6 ?C)   Wt 222 lb 12.8 oz (101.1 kg)   SpO2 97%   BMI 31.07 kg/m?   ? ?Subjective:  ? ? Patient ID: Shawn Shaw, male    DOB: 08-Dec-1960, 61 y.o.   MRN: [id number] ? ?HPI: ?Shawn Shaw is a 61 y.o. male ? ?Chief Complaint  ?Patient presents with  ? Diabetes  ? Colonoscopy  ?  Patient would like referral sent to [facility] for his colonoscopy. [phone]  ? ?DIABETES ?Hypoglycemic episodes:no ?Polydipsia/polyuria: no ?Visual disturbance: no ?Chest pain: no ?Paresthesias: no ?Glucose Monitoring: no ? Accucheck frequency: Not Checking ?Taking Insulin?: no ?Blood Pressure Monitoring: not checking ?Retinal Examination: Not up to Date ?Foot Exam: Not up to Date ?Diabetic Education: Completed ?Pneumovax: Up to Date ?Influenza: Up to Date ?Aspirin: no ? ?Relevant past medical, surgical, family and social history reviewed and updated as indicated. Interim medical history since our last visit reviewed. ?Allergies and medications reviewed and updated. ? ?Review of Systems  ?Constitutional: Negative.   ?Respiratory: Negative.    ?Cardiovascular: Negative.   ?Musculoskeletal: Negative.   ?Neurological: Negative.   ?Psychiatric/Behavioral: Negative.    ? ?Per HPI unless specifically indicated above ? ?   ?Objective:  ?  ?BP 136/79   Pulse (!) 56   Temp 97.9 ?F (36.6 ?C)   Wt 222 lb 12.8 oz (101.1 kg)   SpO2 97%   BMI 31.07 kg/m?   ?Wt Readings from Last 3 Encounters:  ?07/22/21 222 lb 12.8 oz (101.1 kg)  ?04/22/21 223 lb 3.2 oz (101.2 kg)  ?10/15/20 222 lb (100.7 kg)  ?  ?Physical Exam ?Vitals and nursing note reviewed.  ?Constitutional:   ?   General: He is not in acute distress. ?   Appearance: Normal appearance. He is obese. He is not ill-appearing, toxic-appearing or diaphoretic.  ?HENT:  ?   Head: Normocephalic and atraumatic.  ?   Right Ear: External ear normal.  ?   Left Ear: External ear normal.  ?   Nose: Nose normal.  ?   Mouth/Throat:  ?   Mouth:  Mucous membranes are moist.  ?   Pharynx: Oropharynx is clear.  ?Eyes:  ?   General: No scleral icterus.    ?   Right eye: No discharge.     ?   Left eye: No discharge.  ?   Extraocular Movements: Extraocular movements intact.  ?   Conjunctiva/sclera: Conjunctivae normal.  ?   Pupils: Pupils are equal, round, and reactive to light.  ?Cardiovascular:  ?   Rate and Rhythm: Normal rate and regular rhythm.  ?   Pulses: Normal pulses.  ?   Heart sounds: Normal heart sounds. No murmur heard. ?  No friction rub. No gallop.  ?Pulmonary:  ?   Effort: Pulmonary effort is normal. No respiratory distress.  ?   Breath sounds: Normal breath sounds. No stridor. No wheezing, rhonchi or rales.  ?Chest:  ?   Chest wall: No tenderness.  ?Musculoskeletal:     ?   General: Normal range of motion.  ?   Cervical back: Normal range of motion and neck supple.  ?Skin: ?   General: Skin is warm and dry.  ?   Capillary Refill: Capillary refill takes less than 2 seconds.  ?   Coloration: Skin is not jaundiced or pale.  ?   Findings: No bruising, erythema, lesion or rash.  ?Neurological:  ?  General: No focal deficit present.  ?   Mental Status: He is alert and oriented to person, place, and time. Mental status is at baseline.  ?Psychiatric:     ?   Mood and Affect: Mood normal.     ?   Behavior: Behavior normal.     ?   Thought Content: Thought content normal.     ?   Judgment: Judgment normal.  ? ? ?Results for orders placed or performed in visit on 07/22/21  ?Bayer DCA Hb A1c Waived  ?Result Value Ref Range  ? HB A1C (BAYER DCA - WAIVED) 6.6 (H) 4.8 - 5.6 %  ? ?   ?Assessment & Plan:  ? ?Problem List Items Addressed This Visit   ? ?  ? Endocrine  ? DM type 2 with diabetic peripheral neuropathy ([facility]) - Primary  ?  Doing well with A1c of 6.6. Continue current regimen. Call with any concerns. Continue to monitor.  ?  ?  ? Relevant Medications  ? atorvastatin (LIPITOR) 20 MG tablet  ? losartan-hydrochlorothiazide (HYZAAR) 100-25 MG tablet  ?  metFORMIN (GLUCOPHAGE-XR) 500 MG 24 hr tablet  ? pregabalin (LYRICA) 75 MG capsule  ? sitaGLIPtin (JANUVIA) 100 MG tablet  ? Other Relevant Orders  ? Bayer DCA Hb A1c Waived (Completed)  ?  ? Other  ? Hyperlipidemia  ? Relevant Medications  ? amLODipine (NORVASC) 10 MG tablet  ? atorvastatin (LIPITOR) 20 MG tablet  ? losartan-hydrochlorothiazide (HYZAAR) 100-25 MG tablet  ? ?Other Visit Diagnoses   ? ? Essential hypertension      ? Relevant Medications  ? amLODipine (NORVASC) 10 MG tablet  ? atorvastatin (LIPITOR) 20 MG tablet  ? losartan-hydrochlorothiazide (HYZAAR) 100-25 MG tablet  ? Screening for colon cancer      ? Referral to GI placed today.  ? Relevant Orders  ? Ambulatory referral to Gastroenterology  ? ?  ?  ? ?Follow up plan: ?Return in about 3 months (around 10/22/2021). ? ? ? ? ? ?

## 2021-07-22 NOTE — Patient Instructions (Signed)
Stop the atorvastatin for 2 weeks- I'll check with you about your aching.  ?

## 2021-07-28 ENCOUNTER — Telehealth: Payer: Self-pay | Admitting: Family Medicine

## 2021-07-28 ENCOUNTER — Encounter: Payer: Self-pay | Admitting: Family Medicine

## 2021-07-28 NOTE — Telephone Encounter (Signed)
Copied from CRM [id number]. Topic: Referral - Status ?>> Jul 28, 2021  1:24 PM Marylen Ponto wrote: ?Reason for CRM: Pt requests update on referral to [facility] for a colonoscopy. Pt asked that the referral be faxed to fax# [phone] ?

## 2021-08-07 ENCOUNTER — Encounter: Payer: Self-pay | Admitting: Family Medicine

## 2021-08-08 ENCOUNTER — Other Ambulatory Visit: Payer: Self-pay | Admitting: Family Medicine

## 2021-08-08 MED ORDER — ROSUVASTATIN CALCIUM 5 MG PO TABS: 5.0000 mg | ORAL_TABLET | Freq: Every day | ORAL | 1 refills | Status: DC

## 2021-10-19 LAB — HM COLONOSCOPY

## 2021-10-28 ENCOUNTER — Ambulatory Visit: Payer: BC Managed Care – PPO | Admitting: Family Medicine

## 2021-10-28 ENCOUNTER — Encounter: Payer: Self-pay | Admitting: Family Medicine

## 2021-10-28 VITALS — BP 145/83 | HR 49 | Temp 97.6°F | Wt 224.6 lb

## 2021-10-28 DIAGNOSIS — E785 Hyperlipidemia, unspecified: Secondary | ICD-10-CM

## 2021-10-28 DIAGNOSIS — R200 Anesthesia of skin: Secondary | ICD-10-CM | POA: Diagnosis not present

## 2021-10-28 DIAGNOSIS — E1142 Type 2 diabetes mellitus with diabetic polyneuropathy: Secondary | ICD-10-CM | POA: Diagnosis not present

## 2021-10-28 DIAGNOSIS — G5793 Unspecified mononeuropathy of bilateral lower limbs: Secondary | ICD-10-CM

## 2021-10-28 DIAGNOSIS — I10 Essential (primary) hypertension: Secondary | ICD-10-CM

## 2021-10-28 DIAGNOSIS — R972 Elevated prostate specific antigen [PSA]: Secondary | ICD-10-CM

## 2021-10-28 DIAGNOSIS — D692 Other nonthrombocytopenic purpura: Secondary | ICD-10-CM

## 2021-10-28 LAB — MICROALBUMIN, URINE WAIVED
Creatinine, Urine Waived: 50 mg/dL (ref 10–300)
Microalb, Ur Waived: 10 mg/L (ref 0–19)
Microalb/Creat Ratio: <30 mg/g (ref <30)

## 2021-10-28 LAB — BAYER DCA HB A1C WAIVED: HB A1C (BAYER DCA - WAIVED): 6.9 % — ABNORMAL HIGH (ref 4.8–5.6)

## 2021-10-28 NOTE — Progress Notes (Deleted)
BP (!) 164/74   Pulse (!) 49   Temp 97.6 F (36.4 C) (Oral)   Wt 224 lb 9.6 oz (101.9 kg)   SpO2 98%   BMI 31.33 kg/m    Subjective:    Patient ID: Shawn Shaw, male    DOB: Nov 17, 1960, 61 y.o.   MRN: [id number]  HPI: Shawn Shaw is a 61 y.o. male presenting on 10/28/2021 for comprehensive medical examination. Current medical complaints include:{Blank single:19197::"none","***"}  He currently lives with: Interim Problems from his last visit: {Blank single:19197::"yes","no"}  Depression Screen done today and results listed below:     04/22/2021   10:01 AM 10/15/2020    9:40 AM 04/16/2020   10:01 AM 01/02/2020   11:53 AM 09/26/2019   12:13 PM  Depression screen PHQ 2/9  Decreased Interest 0 0 0 2 0  Down, Depressed, Hopeless 0 1 0 0 0  PHQ - 2 Score 0 1 0 2 0  Altered sleeping  0  0 1  Tired, decreased energy  3  3 1   Change in appetite  1  3 3   Feeling bad or failure about yourself   0  2 0  Trouble concentrating  0  3 1  Moving slowly or fidgety/restless  0  3 1  Suicidal thoughts  0  0 0  PHQ-9 Score  5  16 7   Difficult doing work/chores  Not difficult at all  Somewhat difficult Somewhat difficult    The patient {has/does not a history of falls. I {did/did not:19850} complete a risk assessment for falls. A plan of care for falls {was/was not:19852} documented.   Past Medical History:  Past Medical History:  Diagnosis Date   Anxiety    Depression    Diabetes mellitus without complication (HCC)    Hyperlipidemia    Hypertension     Surgical History:  Past Surgical History:  Procedure Laterality Date   PTERYGIUM EXCISION Right     Medications:  Current Outpatient Medications on File Prior to Visit  Medication Sig   amLODipine (NORVASC) 10 MG tablet Take 1 tablet (10 mg total) by mouth daily.   finasteride (PROSCAR) 5 MG tablet 5 mg daily.   losartan-hydrochlorothiazide (HYZAAR) 100-25 MG tablet Take 1 tablet by mouth daily.   metFORMIN  (GLUCOPHAGE-XR) 500 MG 24 hr tablet Take 2 tablets (1,000 mg total) by mouth in the morning and at bedtime.   pregabalin (LYRICA) 75 MG capsule Take 1 in the AM and 2 in the PM   rosuvastatin (CRESTOR) 5 MG tablet Take 1 tablet (5 mg total) by mouth daily.   sildenafil (REVATIO) 20 MG tablet Take 20 mg by mouth daily as needed.   sitaGLIPtin (JANUVIA) 100 MG tablet Take 1 tablet (100 mg total) by mouth daily.   triamcinolone cream (KENALOG) 0.1 % Apply 1 application topically 2 (two) times daily.   No current facility-administered medications on file prior to visit.    Allergies:  Allergies  Allergen Reactions   Trulicity [Dulaglutide] Rash   Lisinopril Cough    Social History:  Social History   Socioeconomic History   Marital status: Unknown    Spouse name: Not on file   Number of children: Not on file   Years of education: Not on file   Highest education level: Not on file  Occupational History   Not on file  Tobacco Use   Smoking status: Former    Types: Cigarettes    Quit date: 12/22/2008  Years since quitting: 12.8   Smokeless tobacco: Never  Vaping Use   Vaping Use: Never used  Substance and Sexual Activity   Alcohol use: Not Currently    Alcohol/week: 0.0 standard drinks of alcohol   Drug use: No   Sexual activity: Not Currently  Other Topics Concern   Not on file  Social History Narrative   Not on file   Social Determinants of Health   Financial Resource Strain: Not on file  Food Insecurity: Not on file  Transportation Needs: Not on file  Physical Activity: Not on file  Stress: Not on file  Social Connections: Not on file  Intimate Partner Violence: Not on file   Social History   Tobacco Use  Smoking Status Former   Types: Cigarettes   Quit date: 12/22/2008   Years since quitting: 12.8  Smokeless Tobacco Never   Social History   Substance and Sexual Activity  Alcohol Use Not Currently   Alcohol/week: 0.0 standard drinks of alcohol     Family History:  Family History  Family history unknown: Yes    Past medical history, surgical history, medications, allergies, family history and social history reviewed with patient today and changes made to appropriate areas of the chart.   ROS All other ROS negative except what is listed above and in the HPI.      Objective:    BP (!) 164/74   Pulse (!) 49   Temp 97.6 F (36.4 C) (Oral)   Wt 224 lb 9.6 oz (101.9 kg)   SpO2 98%   BMI 31.33 kg/m   Wt Readings from Last 3 Encounters:  10/28/21 224 lb 9.6 oz (101.9 kg)  07/22/21 222 lb 12.8 oz (101.1 kg)  04/22/21 223 lb 3.2 oz (101.2 kg)    Physical Exam  Results for orders placed or performed in visit on 07/22/21  Bayer DCA Hb A1c Waived  Result Value Ref Range   HB A1C (BAYER DCA - WAIVED) 6.6 (H) 4.8 - 5.6 %      Assessment & Plan:   Problem List Items Addressed This Visit   None    Discussed aspirin prophylaxis for myocardial infarction prevention and decision was {Blank single:19197::"it was not indicated","made to continue ASA","made to start ASA","made to stop ASA","that we recommended ASA, and patient refused"}  LABORATORY TESTING:  Health maintenance labs ordered today as discussed above.   The natural history of prostate cancer and ongoing controversy regarding screening and potential treatment outcomes of prostate cancer has been discussed with the patient. The meaning of a false positive PSA and a false negative PSA has been discussed. He indicates understanding of the limitations of this screening test and wishes *** to proceed with screening PSA testing.   IMMUNIZATIONS:   - Tdap: Tetanus vaccination status reviewed: {tetanus status:315746}. - Influenza: {Blank single:19197::"Up to date","Administered today","Postponed to flu season","Refused","Given elsewhere"} - Pneumovax: {Blank single:19197::"Up to date","Administered today","Not applicable","Refused","Given elsewhere"} - Prevnar: {Blank  single:19197::"Up to date","Administered today","Not applicable","Refused","Given elsewhere"} - COVID: {Blank single:19197::"Up to date","Administered today","Not applicable","Refused","Given elsewhere"} - HPV: {Blank single:19197::"Up to date","Administered today","Not applicable","Refused","Given elsewhere"} - Shingrix vaccine: {Blank single:19197::"Up to date","Administered today","Not applicable","Refused","Given elsewhere"}  SCREENING: - Colonoscopy: {Blank single:19197::"Up to date","Ordered today","Not applicable","Refused","Done elsewhere"}  Discussed with patient purpose of the colonoscopy is to detect colon cancer at curable precancerous or early stages   - AAA Screening: {Blank single:19197::"Up to date","Ordered today","Not applicable","Refused","Done elsewhere"}  -Hearing Test: {Blank single:19197::"Up to date","Ordered today","Not applicable","Refused","Done elsewhere"}  -Spirometry: {Blank single:19197::"Up to date","Ordered today","Not applicable","Refused","Done  elsewhere"}   PATIENT COUNSELING:    Sexuality: Discussed sexually transmitted diseases, partner selection, use of condoms, avoidance of unintended pregnancy  and contraceptive alternatives.   Advised to avoid cigarette smoking.  I discussed with the patient that most people either abstain from alcohol or drink within safe limits (<=14/week and <=4 drinks/occasion for males, <=7/weeks and <= 3 drinks/occasion for females) and that the risk for alcohol disorders and other health effects rises proportionally with the number of drinks per week and how often a drinker exceeds daily limits.  Discussed cessation/primary prevention of drug use and availability of treatment for abuse.   Diet: Encouraged to adjust caloric intake to maintain  or achieve ideal body weight, to reduce intake of dietary saturated fat and total fat, to limit sodium intake by avoiding high sodium foods and not adding table salt, and to maintain  adequate dietary potassium and calcium preferably from fresh fruits, vegetables, and low-fat dairy products.    stressed the importance of regular exercise  Injury prevention: Discussed safety belts, safety helmets, smoke detector, smoking near bedding or upholstery.   Dental health: Discussed importance of regular tooth brushing, flossing, and dental visits.   Follow up plan: NEXT PREVENTATIVE PHYSICAL DUE IN 1 YEAR. No follow-ups on file.

## 2021-10-28 NOTE — Progress Notes (Unsigned)
BP (!) 145/83   Pulse (!) 49   Temp 97.6 F (36.4 C) (Oral)   Wt 224 lb 9.6 oz (101.9 kg)   SpO2 98%   BMI 31.33 kg/m    Subjective:    Patient ID: Shawn Shaw, male    DOB: [birth year], 61 y.o.   MRN: [id number]  HPI: Shawn Shaw is a 61 y.o. male  Chief Complaint  Patient presents with   Hypertension    3 month follow up    Diabetes   Hyperlipidemia   DIABETES Hypoglycemic episodes:{Blank single:19197::"yes","no"} Polydipsia/polyuria: {Blank single:19197::"yes","no"} Visual disturbance: {Blank single:19197::"yes","no"} Chest pain: {Blank single:19197::"yes","no"} Paresthesias: {Blank single:19197::"yes","no"} Glucose Monitoring: {Blank single:19197::"yes","no"}  Accucheck frequency: {Blank single:19197::"Not Checking","Daily","BID","TID"}  Fasting glucose:  Post prandial:  Evening:  Before meals: Taking Insulin?: {Blank single:19197::"yes","no"}  Long acting insulin:  Short acting insulin: Blood Pressure Monitoring: {Blank single:19197::"not checking","rarely","daily","weekly","monthly","a few times a day","a few times a week","a few times a month"} Retinal Examination: {Blank single:19197::"Up to Date","Not up to Date"} Foot Exam: {Blank single:19197::"Up to Date","Not up to Date"} Diabetic Education: {Blank single:19197::"Completed","Not Completed"} Pneumovax: {Blank single:19197::"Up to Date","Not up to Date","unknown"} Influenza: {Blank single:19197::"Up to Date","Not up to Date","unknown"} Aspirin: {Blank single:19197::"yes","no"}  HYPERTENSION / HYPERLIPIDEMIA Satisfied with current treatment? {Blank single:19197::"yes","no"} Duration of hypertension: {Blank single:19197::"chronic","months","years"} BP monitoring frequency: {Blank single:19197::"not checking","rarely","daily","weekly","monthly","a few times a day","a few times a week","a few times a month"} BP range:  BP medication side effects: {Blank single:19197::"yes","no"} Past BP meds: {Blank  multiple:19196::"none","amlodipine","amlodipine/benazepril","atenolol","benazepril","benazepril/HCTZ","bisoprolol (bystolic)","carvedilol","chlorthalidone","clonidine","diltiazem","exforge HCT","HCTZ","irbesartan (avapro)","labetalol","lisinopril","lisinopril-HCTZ","losartan (cozaar)","methyldopa","nifedipine","olmesartan (benicar)","olmesartan-HCTZ","quinapril","ramipril","spironalactone","tekturna","valsartan","valsartan-HCTZ","verapamil"} Duration of hyperlipidemia: {Blank single:19197::"chronic","months","years"} Cholesterol medication side effects: {Blank single:19197::"yes","no"} Cholesterol supplements: {Blank multiple:19196::"none","fish oil","niacin","red yeast rice"} Past cholesterol medications: {Blank multiple:19196::"none","atorvastain (lipitor)","lovastatin (mevacor)","pravastatin (pravachol)","rosuvastatin (crestor)","simvastatin (zocor)","vytorin","fenofibrate (tricor)","gemfibrozil","ezetimide (zetia)","niaspan","lovaza"} Medication compliance: {Blank single:19197::"excellent compliance","good compliance","fair compliance","poor compliance"} Aspirin: no Recent stressors: {Blank single:19197::"yes","no"} Recurrent headaches: {Blank single:19197::"yes","no"} Visual changes: {Blank single:19197::"yes","no"} Palpitations: {Blank single:19197::"yes","no"} Dyspnea: {Blank single:19197::"yes","no"} Chest pain: {Blank single:19197::"yes","no"} Lower extremity edema: {Blank single:19197::"yes","no"} Dizzy/lightheaded: {Blank single:19197::"yes","no"}   NUMBNESS Duration: {Blank single:19197::"days","weeks","months","chronic"} Onset: {Blank single:19197::"sudden","gradual"} Location:  Bilateral: {Blank single:19197::"yes","no"} Symmetric: {Blank single:19197::"yes","no"} Decreased sensation: {Blank single:19197::"yes","no"}  Weakness: {Blank single:19197::"yes","no"} Pain: {Blank single:19197::"yes","no"} Quality:  {Blank  multiple:19196::"sharp","dull","aching","burning","cramping","ill-defined","itchy","pressure-like","pulling","shooting","sore","stabbing","tender","tearing","throbbing"} Severity: {Blank single:19197::"mild","moderate","severe","1/10","2/10","3/10","4/10","5/10","6/10","7/10","8/10","9/10","10/10"}  Frequency: {Blank single:19197::"constant","intermittent","occasional","rare","every few minutes","a few times a hour","a few times a day","a few times a week","a few times a month","a few times a year"} Trauma: {Blank single:19197::"yes","no"} Recent illness: {Blank single:19197::"yes","no"} Diabetes: {Blank single:19197::"yes","no"} Thyroid disease: {Blank single:19197::"yes","no"}  HIV: {Blank single:19197::"yes","no"}  Alcoholism: {Blank single:19197::"yes","no"}  Spinal cord injury: {Blank single:19197::"yes","no"} Alleviating factors:  Aggravating factors: Status: {Blank single:19197::"controlled","uncontrolled","better","worse","exacerbated","stable"} Treatments attempted:   Relevant past medical, surgical, family and social history reviewed and updated as indicated. Interim medical history since our last visit reviewed. Allergies and medications reviewed and updated.  Review of Systems  Per HPI unless specifically indicated above     Objective:    BP (!) 145/83   Pulse (!) 49   Temp 97.6 F (36.4 C) (Oral)   Wt 224 lb 9.6 oz (101.9 kg)   SpO2 98%   BMI 31.33 kg/m   Wt Readings from Last 3 Encounters:  10/28/21 224 lb 9.6 oz (101.9 kg)  07/22/21 222 lb 12.8 oz (101.1 kg)  04/22/21 223 lb 3.2 oz (101.2 kg)    Physical Exam  Results for orders placed or performed in visit on 07/22/21  Bayer DCA Hb A1c Waived  Result Value Ref Range   HB A1C (BAYER DCA - WAIVED) 6.6 (H) 4.8 - 5.6 %      Assessment & Plan:   Problem List Items Addressed This Visit  Endocrine   DM type 2 with diabetic peripheral neuropathy (HCC)   Relevant Orders   Comprehensive metabolic panel    CBC with Differential/Platelet   Microalbumin, Urine Waived   Bayer DCA Hb A1c Waived     Other   Hyperlipidemia - Primary   Relevant Orders   Comprehensive metabolic panel   CBC with Differential/Platelet   Lipid Panel w/o Chol/HDL Ratio   Other Visit Diagnoses     Essential hypertension       Relevant Orders   Comprehensive metabolic panel   CBC with Differential/Platelet   Microalbumin, Urine Waived   Elevated PSA       Relevant Orders   Comprehensive metabolic panel   CBC with Differential/Platelet   PSA        Follow up plan: No follow-ups on file.

## 2021-10-29 LAB — LIPID PANEL W/O CHOL/HDL RATIO
Cholesterol, Total: 115 mg/dL (ref 100–199)
HDL: 36 mg/dL — ABNORMAL LOW (ref >39)
LDL Chol Calc (NIH): 56 mg/dL (ref 0–99)
Triglycerides: 126 mg/dL (ref 0–149)
VLDL Cholesterol Cal: 23 mg/dL (ref 5–40)

## 2021-10-29 LAB — COMPREHENSIVE METABOLIC PANEL
ALT: 26 IU/L (ref 0–44)
AST: 17 IU/L (ref 0–40)
Albumin/Globulin Ratio: 1.6 (ref 1.2–2.2)
Albumin: 4.5 g/dL (ref 3.8–4.8)
Alkaline Phosphatase: 63 IU/L (ref 44–121)
BUN/Creatinine Ratio: 16 (ref 10–24)
BUN: 19 mg/dL (ref 8–27)
Bilirubin Total: 0.4 mg/dL (ref 0.0–1.2)
CO2: 24 mmol/L (ref 20–29)
Calcium: 9.6 mg/dL (ref 8.6–10.2)
Chloride: 103 mmol/L (ref 96–106)
Creatinine, Ser: 1.2 mg/dL (ref 0.76–1.27)
Globulin, Total: 2.8 g/dL (ref 1.5–4.5)
Glucose: 107 mg/dL — ABNORMAL HIGH (ref 70–99)
Potassium: 4.4 mmol/L (ref 3.5–5.2)
Sodium: 144 mmol/L (ref 134–144)
Total Protein: 7.3 g/dL (ref 6.0–8.5)
eGFR: 69 mL/min/{1.73_m2} (ref >59)

## 2021-10-29 LAB — CBC WITH DIFFERENTIAL/PLATELET
Basophils Absolute: 0.1 10*3/uL (ref 0.0–0.2)
Basos: 1 %
EOS (ABSOLUTE): 0.3 10*3/uL (ref 0.0–0.4)
Eos: 5 %
Hematocrit: 42.1 % (ref 37.5–51.0)
Hemoglobin: 14.5 g/dL (ref 13.0–17.7)
Immature Grans (Abs): 0 10*3/uL (ref 0.0–0.1)
Immature Granulocytes: 0 %
Lymphocytes Absolute: 1.7 10*3/uL (ref 0.7–3.1)
Lymphs: 27 %
MCH: 30.2 pg (ref 26.6–33.0)
MCHC: 34.4 g/dL (ref 31.5–35.7)
MCV: 88 fL (ref 79–97)
Monocytes Absolute: 0.5 10*3/uL (ref 0.1–0.9)
Monocytes: 7 %
Neutrophils Absolute: 3.7 10*3/uL (ref 1.4–7.0)
Neutrophils: 60 %
Platelets: 291 10*3/uL (ref 150–450)
RBC: 4.8 x10E6/uL (ref 4.14–5.80)
RDW: 13.2 % (ref 11.6–15.4)
WBC: 6.2 10*3/uL (ref 3.4–10.8)

## 2021-10-29 LAB — PSA: Prostate Specific Ag, Serum: 4.4 ng/mL — ABNORMAL HIGH (ref 0.0–4.0)

## 2021-10-31 ENCOUNTER — Encounter: Payer: Self-pay | Admitting: Family Medicine

## 2021-10-31 ENCOUNTER — Other Ambulatory Visit: Payer: Self-pay | Admitting: Family Medicine

## 2021-10-31 DIAGNOSIS — R972 Elevated prostate specific antigen [PSA]: Secondary | ICD-10-CM

## 2021-10-31 MED ORDER — SITAGLIPTIN PHOSPHATE 100 MG PO TABS: 100.0000 mg | ORAL_TABLET | Freq: Every day | ORAL | 1 refills | Status: DC

## 2021-10-31 MED ORDER — ROSUVASTATIN CALCIUM 5 MG PO TABS: 5.0000 mg | ORAL_TABLET | Freq: Every day | ORAL | 1 refills | Status: DC

## 2021-10-31 MED ORDER — METFORMIN HCL ER 500 MG PO TB24: 1000.0000 mg | ORAL_TABLET | Freq: Two times a day (BID) | ORAL | 1 refills | Status: DC

## 2021-10-31 MED ORDER — AMLODIPINE BESYLATE 10 MG PO TABS: 10.0000 mg | ORAL_TABLET | Freq: Every day | ORAL | 1 refills | Status: DC

## 2021-10-31 MED ORDER — LOSARTAN POTASSIUM-HCTZ 100-25 MG PO TABS: 1.0000 | ORAL_TABLET | Freq: Every day | ORAL | 1 refills | Status: DC

## 2021-10-31 MED ORDER — PREGABALIN 75 MG PO CAPS: ORAL_CAPSULE | ORAL | 1 refills | Status: DC

## 2021-10-31 NOTE — Assessment & Plan Note (Signed)
Under good control on current regimen. Continue current regimen. Continue to monitor. Call with any concerns. Refills given. Labs drawn today.   

## 2021-10-31 NOTE — Assessment & Plan Note (Signed)
Under good control on current regimen. Continue current regimen. Continue to monitor. Call with any concerns. Refills given.   

## 2021-10-31 NOTE — Assessment & Plan Note (Signed)
Reassured patient. Continue to monitor.  

## 2021-10-31 NOTE — Assessment & Plan Note (Signed)
Elevated with A1c of 6.9. Will continue current regimen. Continue to monitor. Work on diet and exercise. Recheck 3 months. Call with any concerns.

## 2021-11-10 ENCOUNTER — Ambulatory Visit
Admission: RE | Admit: 2021-11-10 | Discharge: 2021-11-10 | Disposition: A | Discharge status: Home / Self Care | Payer: BC Managed Care – PPO | Source: Ambulatory Visit | Attending: Family Medicine | Admitting: Family Medicine

## 2021-11-10 ENCOUNTER — Ambulatory Visit
Admission: RE | Admit: 2021-11-10 | Discharge: 2021-11-10 | Disposition: A | Discharge status: Home / Self Care | Payer: BC Managed Care – PPO | Attending: Family Medicine | Admitting: Family Medicine

## 2021-11-10 DIAGNOSIS — R200 Anesthesia of skin: Secondary | ICD-10-CM

## 2021-12-02 ENCOUNTER — Other Ambulatory Visit: Payer: BC Managed Care – PPO

## 2022-01-02 ENCOUNTER — Encounter: Payer: Self-pay | Admitting: Family Medicine

## 2022-01-27 ENCOUNTER — Ambulatory Visit: Payer: BC Managed Care – PPO | Admitting: Family Medicine

## 2022-02-02 ENCOUNTER — Ambulatory Visit: Payer: BC Managed Care – PPO | Admitting: Family Medicine

## 2022-02-03 ENCOUNTER — Encounter: Payer: Self-pay | Admitting: Family Medicine

## 2022-02-03 ENCOUNTER — Ambulatory Visit: Payer: BC Managed Care – PPO | Admitting: Family Medicine

## 2022-02-03 VITALS — BP 131/79 | HR 50 | Temp 98.2°F | Wt 220.8 lb

## 2022-02-03 DIAGNOSIS — E1142 Type 2 diabetes mellitus with diabetic polyneuropathy: Secondary | ICD-10-CM

## 2022-02-03 LAB — BAYER DCA HB A1C WAIVED: HB A1C (BAYER DCA - WAIVED): 6.9 % — ABNORMAL HIGH (ref 4.8–5.6)

## 2022-02-03 NOTE — Assessment & Plan Note (Signed)
Doing well with A1c of 6.9. Continue current regimen. Continue to monitor. Call with any concerns.  

## 2022-02-03 NOTE — Progress Notes (Signed)
BP 131/79   Pulse (!) 50   Temp 98.2 F (36.8 C)   Wt 220 lb 12.8 oz (100.2 kg)   SpO2 97%   BMI 30.80 kg/m    Subjective:    Patient ID: Shawn Shaw, male    DOB: 02/19/1961, 61 y.o.   MRN: [id number]  HPI: Shawn Shaw is a 61 y.o. male  Chief Complaint  Patient presents with   Diabetes    Has not had eye exam this year    DIABETES- has been off his [place] for 2-3 days Hypoglycemic episodes:no Polydipsia/polyuria: no Visual disturbance: no Chest pain: no Paresthesias: no Glucose Monitoring: no  Accucheck frequency: Not Checking Taking Insulin?: no Blood Pressure Monitoring: not checking Retinal Examination: Not up to Date Foot Exam: Up to Date Diabetic Education: Completed Pneumovax: Up to Date Influenza: declined Aspirin: no  Relevant past medical, surgical, family and social history reviewed and updated as indicated. Interim medical history since our last visit reviewed. Allergies and medications reviewed and updated.  Review of Systems  Constitutional: Negative.   Respiratory: Negative.    Cardiovascular: Negative.   Gastrointestinal: Negative.   Musculoskeletal: Negative.   Neurological: Negative.   Psychiatric/Behavioral: Negative.      Per HPI unless specifically indicated above     Objective:    BP 131/79   Pulse (!) 50   Temp 98.2 F (36.8 C)   Wt 220 lb 12.8 oz (100.2 kg)   SpO2 97%   BMI 30.80 kg/m   Wt Readings from Last 3 Encounters:  02/03/22 220 lb 12.8 oz (100.2 kg)  10/28/21 224 lb 9.6 oz (101.9 kg)  07/22/21 222 lb 12.8 oz (101.1 kg)    Physical Exam Vitals and nursing note reviewed.  Constitutional:      General: He is not in acute distress.    Appearance: Normal appearance. He is normal weight. He is not ill-appearing, toxic-appearing or diaphoretic.  HENT:     Head: Normocephalic and atraumatic.     Right Ear: External ear normal.     Left Ear: External ear normal.     Nose: Nose normal.     Mouth/Throat:      Mouth: Mucous membranes are moist.     Pharynx: Oropharynx is clear.  Eyes:     General: No scleral icterus.       Right eye: No discharge.        Left eye: No discharge.     Extraocular Movements: Extraocular movements intact.     Conjunctiva/sclera: Conjunctivae normal.     Pupils: Pupils are equal, round, and reactive to light.  Cardiovascular:     Rate and Rhythm: Normal rate and regular rhythm.     Pulses: Normal pulses.     Heart sounds: Normal heart sounds. No murmur heard.    No friction rub. No gallop.  Pulmonary:     Effort: Pulmonary effort is normal. No respiratory distress.     Breath sounds: Normal breath sounds. No stridor. No wheezing, rhonchi or rales.  Chest:     Chest wall: No tenderness.  Musculoskeletal:        General: Normal range of motion.     Cervical back: Normal range of motion and neck supple.  Skin:    General: Skin is warm and dry.     Capillary Refill: Capillary refill takes less than 2 seconds.     Coloration: Skin is not jaundiced or pale.     Findings: No bruising, erythema,  lesion or rash.  Neurological:     General: No focal deficit present.     Mental Status: He is alert and oriented to person, place, and time. Mental status is at baseline.  Psychiatric:        Mood and Affect: Mood normal.        Behavior: Behavior normal.        Thought Content: Thought content normal.        Judgment: Judgment normal.     Results for orders placed or performed in visit on 02/03/22  Bayer DCA Hb A1c Waived  Result Value Ref Range   HB A1C (BAYER DCA - WAIVED) 6.9 (H) 4.8 - 5.6 %      Assessment & Plan:   Problem List Items Addressed This Visit       Endocrine   DM type 2 with diabetic peripheral neuropathy ([place]) - Primary    Doing well with A1c of 6.9. Continue current regimen. Continue to monitor. Call with any concerns.       Relevant Orders   Bayer DCA Hb A1c Waived (Completed)     Follow up plan: Return in about 3 months (around  05/05/2022) for physical.

## 2022-05-05 ENCOUNTER — Ambulatory Visit (INDEPENDENT_AMBULATORY_CARE_PROVIDER_SITE_OTHER): Payer: BC Managed Care – PPO | Admitting: Family Medicine

## 2022-05-05 ENCOUNTER — Encounter: Payer: Self-pay | Admitting: Family Medicine

## 2022-05-05 VITALS — BP 138/75 | HR 59 | Temp 98.2°F | Ht 71.6 in | Wt 223.9 lb

## 2022-05-05 DIAGNOSIS — D692 Other nonthrombocytopenic purpura: Secondary | ICD-10-CM | POA: Diagnosis not present

## 2022-05-05 DIAGNOSIS — Z0000 Encounter for general adult medical examination without abnormal findings: Secondary | ICD-10-CM | POA: Diagnosis not present

## 2022-05-05 DIAGNOSIS — E785 Hyperlipidemia, unspecified: Secondary | ICD-10-CM

## 2022-05-05 DIAGNOSIS — I10 Essential (primary) hypertension: Secondary | ICD-10-CM | POA: Diagnosis not present

## 2022-05-05 DIAGNOSIS — Z23 Encounter for immunization: Secondary | ICD-10-CM | POA: Diagnosis not present

## 2022-05-05 DIAGNOSIS — E1142 Type 2 diabetes mellitus with diabetic polyneuropathy: Secondary | ICD-10-CM | POA: Diagnosis not present

## 2022-05-05 DIAGNOSIS — Z114 Encounter for screening for human immunodeficiency virus [HIV]: Secondary | ICD-10-CM

## 2022-05-05 DIAGNOSIS — Z1211 Encounter for screening for malignant neoplasm of colon: Secondary | ICD-10-CM

## 2022-05-05 DIAGNOSIS — N138 Other obstructive and reflux uropathy: Secondary | ICD-10-CM

## 2022-05-05 DIAGNOSIS — N401 Enlarged prostate with lower urinary tract symptoms: Secondary | ICD-10-CM

## 2022-05-05 LAB — MICROALBUMIN, URINE WAIVED
Creatinine, Urine Waived: 200 mg/dL (ref 10–300)
Microalb, Ur Waived: 30 mg/L — ABNORMAL HIGH (ref 0–19)
Microalb/Creat Ratio: <30 mg/g (ref <30)

## 2022-05-05 LAB — URINALYSIS, ROUTINE W REFLEX MICROSCOPIC
Bilirubin, UA: NEGATIVE
Glucose, UA: NEGATIVE
Ketones, UA: NEGATIVE
Leukocytes,UA: NEGATIVE
Nitrite, UA: NEGATIVE
Protein,UA: NEGATIVE
RBC, UA: NEGATIVE
Specific Gravity, UA: 1.025 (ref 1.005–1.030)
Urobilinogen, Ur: 0.2 mg/dL (ref 0.2–1.0)
pH, UA: 5.5 (ref 5.0–7.5)

## 2022-05-05 LAB — BAYER DCA HB A1C WAIVED: HB A1C (BAYER DCA - WAIVED): 6.7 % — ABNORMAL HIGH (ref 4.8–5.6)

## 2022-05-05 MED ORDER — METFORMIN HCL ER 500 MG PO TB24: 1000.0000 mg | ORAL_TABLET | Freq: Two times a day (BID) | ORAL | 1 refills | Status: DC

## 2022-05-05 MED ORDER — PREGABALIN 75 MG PO CAPS: ORAL_CAPSULE | ORAL | 1 refills | Status: DC

## 2022-05-05 MED ORDER — ROSUVASTATIN CALCIUM 5 MG PO TABS: 5.0000 mg | ORAL_TABLET | Freq: Every day | ORAL | 1 refills | Status: DC

## 2022-05-05 MED ORDER — SITAGLIPTIN PHOSPHATE 100 MG PO TABS: 100.0000 mg | ORAL_TABLET | Freq: Every day | ORAL | 1 refills | Status: DC

## 2022-05-05 MED ORDER — LOSARTAN POTASSIUM-HCTZ 100-25 MG PO TABS: 1.0000 | ORAL_TABLET | Freq: Every day | ORAL | 1 refills | Status: DC

## 2022-05-05 MED ORDER — AMLODIPINE BESYLATE 10 MG PO TABS: 10.0000 mg | ORAL_TABLET | Freq: Every day | ORAL | 1 refills | Status: DC

## 2022-05-05 NOTE — Assessment & Plan Note (Signed)
Under good control on current regimen. Continue current regimen. Continue to monitor. Call with any concerns. Refills given. Labs drawn today.   

## 2022-05-05 NOTE — Assessment & Plan Note (Signed)
Under good control on current regimen. Continue current regimen. Continue to monitor. Call with any concerns. Labs drawn today. Continue to follow with urology.  °

## 2022-05-05 NOTE — Progress Notes (Signed)
BP 138/75 (BP Location: Left Arm, Cuff Size: Normal)   Pulse (!) 59   Temp 98.2 F (36.8 C) (Oral)   Ht 5' 11.6" (1.819 m)   Wt 223 lb 14.4 oz (101.6 kg)   SpO2 97%   BMI 30.71 kg/m    Subjective:    Patient ID: Shawn Shaw, male    DOB: [birth year], 61 y.o.   MRN: [id number]  HPI: Shawn Shaw is a 61 y.o. male presenting on 05/05/2022 for comprehensive medical examination. Current medical complaints include:  DIABETES Hypoglycemic episodes:yes Polydipsia/polyuria: no Visual disturbance: no Chest pain: no Paresthesias: no Glucose Monitoring: yes  Accucheck frequency: Daily  Fasting glucose: 140 Taking Insulin?: no Blood Pressure Monitoring: not checking Retinal Examination: Not up to Date Foot Exam: Up to Date Diabetic Education: Completed Pneumovax: Up to Date Influenza:  Administered today Aspirin: no  HYPERTENSION / HYPERLIPIDEMIA Satisfied with current treatment? yes Duration of hypertension: chronic BP monitoring frequency: not checking BP medication side effects: no Past BP meds: amlodipine, losartan, HCTZ Duration of hyperlipidemia: chronic Cholesterol medication side effects: no Cholesterol supplements: none Past cholesterol medications: crestor Medication compliance: excellent compliance Aspirin: no Recent stressors: no Recurrent headaches: no Visual changes: no Palpitations: no Dyspnea: no Chest pain: no Lower extremity edema: no Dizzy/lightheaded: no  Interim Problems from his last visit: no  Depression Screen done today and results listed below:     05/05/2022   10:25 AM 02/03/2022   10:10 AM 10/28/2021   10:13 AM 04/22/2021   10:01 AM 10/15/2020    9:40 AM  Depression screen PHQ 2/9  Decreased Interest 3 0 0 0 0  Down, Depressed, Hopeless 3 0 0 0 1  PHQ - 2 Score 6 0 0 0 1  Altered sleeping 0 0 0  0  Tired, decreased energy 3 1 3  3   Change in appetite 0 0 0  1  Feeling bad or failure about yourself  0 0 0  0  Trouble  concentrating 0 0 0  0  Moving slowly or fidgety/restless 2 0 0  0  Suicidal thoughts 0 0 0  0  PHQ-9 Score 11 1 3  5   Difficult doing work/chores Somewhat difficult Not difficult at all Not difficult at all  Not difficult at all    Past Medical History:  Past Medical History:  Diagnosis Date   Anxiety    Depression    Diabetes mellitus without complication (HCC)    Hyperlipidemia    Hypertension     Surgical History:  Past Surgical History:  Procedure Laterality Date   PTERYGIUM EXCISION Right     Medications:  Current Outpatient Medications on File Prior to Visit  Medication Sig   finasteride (PROSCAR) 5 MG tablet 5 mg daily.   sildenafil (REVATIO) 20 MG tablet Take 20 mg by mouth daily as needed.   triamcinolone cream (KENALOG) 0.1 % Apply 1 application topically 2 (two) times daily.   No current facility-administered medications on file prior to visit.    Allergies:  Allergies  Allergen Reactions   Trulicity [Dulaglutide] Rash   Lisinopril Cough    Social History:  Social History   Socioeconomic History   Marital status: Unknown    Spouse name: Not on file   Number of children: Not on file   Years of education: Not on file   Highest education level: Not on file  Occupational History   Not on file  Tobacco Use   Smoking status: Former  Types: Cigarettes    Quit date: 12/22/2008    Years since quitting: 13.3   Smokeless tobacco: Never  Vaping Use   Vaping Use: Never used  Substance and Sexual Activity   Alcohol use: Not Currently    Alcohol/week: 0.0 standard drinks of alcohol   Drug use: No   Sexual activity: Not Currently  Other Topics Concern   Not on file  Social History Narrative   Not on file   Social Determinants of Health   Financial Resource Strain: Not on file  Food Insecurity: Not on file  Transportation Needs: Not on file  Physical Activity: Not on file  Stress: Not on file  Social Connections: Not on file  Intimate Partner  Violence: Not on file   Social History   Tobacco Use  Smoking Status Former   Types: Cigarettes   Quit date: 12/22/2008   Years since quitting: 13.3  Smokeless Tobacco Never   Social History   Substance and Sexual Activity  Alcohol Use Not Currently   Alcohol/week: 0.0 standard drinks of alcohol    Family History:  Family History  Family history unknown: Yes    Past medical history, surgical history, medications, allergies, family history and social history reviewed with patient today and changes made to appropriate areas of the chart.   Review of Systems  Constitutional:  Positive for diaphoresis. Negative for chills, fever, malaise/fatigue and weight loss.  HENT: Negative.    Eyes:  Positive for blurred vision. Negative for double vision, photophobia, pain, discharge and redness.  Respiratory:  Positive for shortness of breath. Negative for cough, hemoptysis, sputum production and wheezing.   Cardiovascular: Negative.   Gastrointestinal:  Positive for constipation and heartburn. Negative for abdominal pain, blood in stool, diarrhea, melena, nausea and vomiting.  Genitourinary: Negative.   Musculoskeletal: Negative.   Skin:  Positive for rash. Negative for itching.  Neurological:  Positive for tingling. Negative for dizziness, tremors, sensory change, speech change, focal weakness, seizures, loss of consciousness, weakness and headaches.  Endo/Heme/Allergies:  Negative for environmental allergies and polydipsia. Bruises/bleeds easily.  Psychiatric/Behavioral: Negative.     All other ROS negative except what is listed above and in the HPI.      Objective:    BP 138/75 (BP Location: Left Arm, Cuff Size: Normal)   Pulse (!) 59   Temp 98.2 F (36.8 C) (Oral)   Ht 5' 11.6" (1.819 m)   Wt 223 lb 14.4 oz (101.6 kg)   SpO2 97%   BMI 30.71 kg/m   Wt Readings from Last 3 Encounters:  05/05/22 223 lb 14.4 oz (101.6 kg)  02/03/22 220 lb 12.8 oz (100.2 kg)  10/28/21 224 lb  9.6 oz (101.9 kg)    Physical Exam Vitals and nursing note reviewed.  Constitutional:      General: He is not in acute distress.    Appearance: Normal appearance. He is obese. He is not ill-appearing, toxic-appearing or diaphoretic.  HENT:     Head: Normocephalic and atraumatic.     Right Ear: Tympanic membrane, ear canal and external ear normal. There is no impacted cerumen.     Left Ear: Tympanic membrane, ear canal and external ear normal. There is no impacted cerumen.     Nose: Nose normal. No congestion or rhinorrhea.     Mouth/Throat:     Mouth: Mucous membranes are moist.     Pharynx: Oropharynx is clear. No oropharyngeal exudate or posterior oropharyngeal erythema.  Eyes:     General:  No scleral icterus.       Right eye: No discharge.        Left eye: No discharge.     Extraocular Movements: Extraocular movements intact.     Conjunctiva/sclera: Conjunctivae normal.     Pupils: Pupils are equal, round, and reactive to light.  Neck:     Vascular: No carotid bruit.  Cardiovascular:     Rate and Rhythm: Normal rate and regular rhythm.     Pulses: Normal pulses.     Heart sounds: No murmur heard.    No friction rub. No gallop.  Pulmonary:     Effort: Pulmonary effort is normal. No respiratory distress.     Breath sounds: Normal breath sounds. No stridor. No wheezing, rhonchi or rales.  Chest:     Chest wall: No tenderness.  Abdominal:     General: Abdomen is flat. Bowel sounds are normal. There is no distension.     Palpations: Abdomen is soft. There is no mass.     Tenderness: There is no abdominal tenderness. There is no right CVA tenderness, left CVA tenderness, guarding or rebound.     Hernia: A hernia (umbilical) is present.  Genitourinary:    Comments: Genital exam deferred with shared decision making Musculoskeletal:        General: No swelling, tenderness, deformity or signs of injury.     Cervical back: Normal range of motion and neck supple. No rigidity. No  muscular tenderness.     Right lower leg: No edema.     Left lower leg: No edema.  Lymphadenopathy:     Cervical: No cervical adenopathy.  Skin:    General: Skin is warm and dry.     Capillary Refill: Capillary refill takes less than 2 seconds.     Coloration: Skin is not jaundiced or pale.     Findings: No bruising, erythema, lesion or rash.  Neurological:     General: No focal deficit present.     Mental Status: He is alert and oriented to person, place, and time.     Cranial Nerves: No cranial nerve deficit.     Sensory: No sensory deficit.     Motor: No weakness.     Coordination: Coordination normal.     Gait: Gait normal.     Deep Tendon Reflexes: Reflexes normal.  Psychiatric:        Mood and Affect: Mood normal.        Behavior: Behavior normal.        Thought Content: Thought content normal.        Judgment: Judgment normal.     Results for orders placed or performed in visit on 02/03/22  Bayer DCA Hb A1c Waived  Result Value Ref Range   HB A1C (BAYER DCA - WAIVED) 6.9 (H) 4.8 - 5.6 %      Assessment & Plan:   Problem List Items Addressed This Visit       Cardiovascular and Mediastinum   Hypertension    Under good control on current regimen. Continue current regimen. Continue to monitor. Call with any concerns. Refills given. Labs drawn today.        Relevant Medications   amLODipine (NORVASC) 10 MG tablet   losartan-hydrochlorothiazide (HYZAAR) 100-25 MG tablet   rosuvastatin (CRESTOR) 5 MG tablet   Other Relevant Orders   Microalbumin, Urine Waived   Comprehensive metabolic panel   CBC with Differential/Platelet   TSH   Urinalysis, Routine w reflex microscopic  Senile purpura (HCC)    Reassured patient. Continue to monitor.       Relevant Medications   amLODipine (NORVASC) 10 MG tablet   losartan-hydrochlorothiazide (HYZAAR) 100-25 MG tablet   rosuvastatin (CRESTOR) 5 MG tablet   Other Relevant Orders   Comprehensive metabolic panel   CBC  with Differential/Platelet     Endocrine   DM type 2 with diabetic peripheral neuropathy (HCC)    Doing well with A1c of 6.7. Continue current regimen. Continue to monitor. Call with any concerns.       Relevant Medications   losartan-hydrochlorothiazide (HYZAAR) 100-25 MG tablet   metFORMIN (GLUCOPHAGE-XR) 500 MG 24 hr tablet   pregabalin (LYRICA) 75 MG capsule   rosuvastatin (CRESTOR) 5 MG tablet   sitaGLIPtin (JANUVIA) 100 MG tablet   Other Relevant Orders   Microalbumin, Urine Waived   Comprehensive metabolic panel   CBC with Differential/Platelet   Bayer DCA Hb A1c Waived     Genitourinary   BPH with obstruction/lower urinary tract symptoms    Under good control on current regimen. Continue current regimen. Continue to monitor. Call with any concerns. Labs drawn today. Continue to follow with urology.       Relevant Orders   Comprehensive metabolic panel   CBC with Differential/Platelet   PSA     Other   Hyperlipidemia    Under good control on current regimen. Continue current regimen. Continue to monitor. Call with any concerns. Refills given. Labs drawn today.       Relevant Medications   amLODipine (NORVASC) 10 MG tablet   losartan-hydrochlorothiazide (HYZAAR) 100-25 MG tablet   rosuvastatin (CRESTOR) 5 MG tablet   Other Relevant Orders   Comprehensive metabolic panel   CBC with Differential/Platelet   Lipid Panel w/o Chol/HDL Ratio   Other Visit Diagnoses     Routine general medical examination at a health care facility    -  Primary   Vaccines up to date. Screening labs checked today. Colonoscopy due in June- referral generated. Continue diet and exercise. Call with any concerns.   Screening for HIV (human immunodeficiency virus)       Labs drawn today.   Relevant Orders   HIV Antibody (routine testing w rflx)   Essential hypertension       Relevant Medications   amLODipine (NORVASC) 10 MG tablet   losartan-hydrochlorothiazide (HYZAAR) 100-25 MG tablet    rosuvastatin (CRESTOR) 5 MG tablet   Screening for colon cancer       Referral generated today.   Relevant Orders   Ambulatory referral to Gastroenterology         LABORATORY TESTING:  Health maintenance labs ordered today as discussed above.   The natural history of prostate cancer and ongoing controversy regarding screening and potential treatment outcomes of prostate cancer has been discussed with the patient. The meaning of a false positive PSA and a false negative PSA has been discussed. He indicates understanding of the limitations of this screening test and wishes to proceed with screening PSA testing.   IMMUNIZATIONS:   - Tdap: Tetanus vaccination status reviewed: last tetanus booster within 10 years. - Influenza: Administered today - Pneumovax: Up to date - Prevnar: Not applicable - COVID: Up to date - HPV: Not applicable - Shingrix vaccine: Refused  SCREENING: - Colonoscopy: Up to date  Discussed with patient purpose of the colonoscopy is to detect colon cancer at curable precancerous or early stages   PATIENT COUNSELING:    Sexuality: Discussed sexually transmitted  diseases, partner selection, use of condoms, avoidance of unintended pregnancy  and contraceptive alternatives.   Advised to avoid cigarette smoking.  I discussed with the patient that most people either abstain from alcohol or drink within safe limits (<=14/week and <=4 drinks/occasion for males, <=7/weeks and <= 3 drinks/occasion for females) and that the risk for alcohol disorders and other health effects rises proportionally with the number of drinks per week and how often a drinker exceeds daily limits.  Discussed cessation/primary prevention of drug use and availability of treatment for abuse.   Diet: Encouraged to adjust caloric intake to maintain  or achieve ideal body weight, to reduce intake of dietary saturated fat and total fat, to limit sodium intake by avoiding high sodium foods and not  adding table salt, and to maintain adequate dietary potassium and calcium preferably from fresh fruits, vegetables, and low-fat dairy products.    stressed the importance of regular exercise  Injury prevention: Discussed safety belts, safety helmets, smoke detector, smoking near bedding or upholstery.   Dental health: Discussed importance of regular tooth brushing, flossing, and dental visits.   Follow up plan: NEXT PREVENTATIVE PHYSICAL DUE IN 1 YEAR. Return in about 6 months (around 11/04/2022).

## 2022-05-05 NOTE — Assessment & Plan Note (Signed)
Doing well with A1c of 6.7. Continue current regimen. Continue to monitor. Call with any concerns.  

## 2022-05-05 NOTE — Assessment & Plan Note (Signed)
Reassured patient. Continue to monitor.  

## 2022-05-06 LAB — COMPREHENSIVE METABOLIC PANEL
ALT: 26 IU/L (ref 0–44)
AST: 18 IU/L (ref 0–40)
Albumin/Globulin Ratio: 1.5 (ref 1.2–2.2)
Albumin: 4.3 g/dL (ref 3.9–4.9)
Alkaline Phosphatase: 66 IU/L (ref 44–121)
BUN/Creatinine Ratio: 13 (ref 10–24)
BUN: 17 mg/dL (ref 8–27)
Bilirubin Total: 0.4 mg/dL (ref 0.0–1.2)
CO2: 20 mmol/L (ref 20–29)
Calcium: 9.6 mg/dL (ref 8.6–10.2)
Chloride: 103 mmol/L (ref 96–106)
Creatinine, Ser: 1.27 mg/dL (ref 0.76–1.27)
Globulin, Total: 2.8 g/dL (ref 1.5–4.5)
Glucose: 145 mg/dL — ABNORMAL HIGH (ref 70–99)
Potassium: 4.1 mmol/L (ref 3.5–5.2)
Sodium: 138 mmol/L (ref 134–144)
Total Protein: 7.1 g/dL (ref 6.0–8.5)
eGFR: 64 mL/min/{1.73_m2} (ref >59)

## 2022-05-06 LAB — CBC WITH DIFFERENTIAL/PLATELET
Basophils Absolute: 0 10*3/uL (ref 0.0–0.2)
Basos: 1 %
EOS (ABSOLUTE): 0.2 10*3/uL (ref 0.0–0.4)
Eos: 3 %
Hematocrit: 43.7 % (ref 37.5–51.0)
Hemoglobin: 14.7 g/dL (ref 13.0–17.7)
Immature Grans (Abs): 0 10*3/uL (ref 0.0–0.1)
Immature Granulocytes: 0 %
Lymphocytes Absolute: 1.6 10*3/uL (ref 0.7–3.1)
Lymphs: 20 %
MCH: 29.3 pg (ref 26.6–33.0)
MCHC: 33.6 g/dL (ref 31.5–35.7)
MCV: 87 fL (ref 79–97)
Monocytes Absolute: 0.5 10*3/uL (ref 0.1–0.9)
Monocytes: 6 %
Neutrophils Absolute: 5.5 10*3/uL (ref 1.4–7.0)
Neutrophils: 70 %
Platelets: 323 10*3/uL (ref 150–450)
RBC: 5.02 x10E6/uL (ref 4.14–5.80)
RDW: 12.8 % (ref 11.6–15.4)
WBC: 7.8 10*3/uL (ref 3.4–10.8)

## 2022-05-06 LAB — LIPID PANEL W/O CHOL/HDL RATIO
Cholesterol, Total: 137 mg/dL (ref 100–199)
HDL: 39 mg/dL — ABNORMAL LOW (ref >39)
LDL Chol Calc (NIH): 72 mg/dL (ref 0–99)
Triglycerides: 147 mg/dL (ref 0–149)
VLDL Cholesterol Cal: 26 mg/dL (ref 5–40)

## 2022-05-06 LAB — HIV ANTIBODY (ROUTINE TESTING W REFLEX): HIV Screen 4th Generation wRfx: NONREACTIVE

## 2022-05-06 LAB — PSA: Prostate Specific Ag, Serum: 2.7 ng/mL (ref 0.0–4.0)

## 2022-05-06 LAB — TSH: TSH: 1.65 u[IU]/mL (ref 0.450–4.500)

## 2022-09-29 ENCOUNTER — Ambulatory Visit
Admission: EM | Admit: 2022-09-29 | Discharge: 2022-09-29 | Disposition: A | Discharge status: Home / Self Care | Payer: BC Managed Care – PPO | Attending: Physician Assistant | Admitting: Physician Assistant

## 2022-09-29 DIAGNOSIS — H6121 Impacted cerumen, right ear: Secondary | ICD-10-CM | POA: Diagnosis not present

## 2022-09-29 DIAGNOSIS — H60391 Other infective otitis externa, right ear: Secondary | ICD-10-CM | POA: Diagnosis not present

## 2022-09-29 MED ORDER — CIPROFLOXACIN-DEXAMETHASONE 0.3-0.1 % OT SUSP: 4.0000 [drp] | Freq: Two times a day (BID) | OTIC | 0 refills | Status: AC

## 2022-09-29 NOTE — ED Triage Notes (Signed)
Pt c/o ear pain in right ear x2days  Pt states that he has been using "ear oil" in his ear and is now having right side ear and facial pain.

## 2022-09-29 NOTE — Discharge Instructions (Addendum)
-  We cleaned some wax out of her ear and also debris.  I am concerned that you have an infection in your ear canal.  Placed a wick in your ear to keep the canal open so the eardrops can go through and treat the condition.  It contains a corticosteroid to help with swelling and antibiotic to treat the infection.  As the swelling goes down the ear wick will fall out. - Whenever you are showering place a cottonball in your ear so that that it does not get wet. - You can have Tylenol or Motrin as needed for discomfort. - Symptoms should be improving over the next week.  However if they are not or if they are worsening, return for reevaluation.

## 2022-09-29 NOTE — ED Provider Notes (Signed)
[facility]    CSN: [id number] Arrival date & time: 09/29/22  0944      History   Chief Complaint Chief Complaint  Patient presents with   Ear Pain    HPI Shawn Shaw is a 62 y.o. male presenting for approximate 2 history of right ear discomfort, pressure, fullness and reduced hearing.  He reports that he thought it was stopped up with earwax so he instilled ear oil.  Now he says his discomfort is worse.  No associated fever, headaches, congestion, cough, sore throat.  No dizziness.  No other complaints.  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 Authorizing Provider  amLODipine (NORVASC) 10 MG tablet Take 1 tablet (10 mg total) by mouth daily. 05/05/22  Yes Johnson, Megan P, DO  ciprofloxacin-dexamethasone (CIPRODEX) OTIC suspension Place 4 drops into the right ear 2 (two) times daily for 7 days. 09/29/22 10/06/22 Yes Eusebio Friendly B, PA-C  finasteride (PROSCAR) 5 MG tablet 5 mg daily. 07/27/15  Yes [provider]  losartan-hydrochlorothiazide (HYZAAR) 100-25 MG tablet Take 1 tablet by mouth daily. 05/05/22  Yes Johnson, Megan P, DO  metFORMIN (GLUCOPHAGE-XR) 500 MG 24 hr tablet Take 2 tablets (1,000 mg total) by mouth in the morning and at bedtime. 05/05/22  Yes Johnson, Megan P, DO  pregabalin (LYRICA) 75 MG capsule Take 1 in the AM and 2 in the PM 05/05/22  Yes Johnson,  Megan P, DO  rosuvastatin (CRESTOR) 5 MG tablet Take 1 tablet (5 mg total) by mouth daily. 05/05/22  Yes Johnson, Megan P, DO  sildenafil (REVATIO) 20 MG tablet Take 20 mg by mouth daily as needed. 08/14/19  Yes [provider]  sitaGLIPtin (JANUVIA) 100 MG tablet Take 1 tablet (100 mg total) by mouth daily. 05/05/22  Yes Johnson, Megan P, DO  triamcinolone cream (KENALOG) 0.1 % Apply 1 application topically 2 (two) times daily. 04/22/21  Yes Dorcas Carrow, DO    Family History Family History  Family history unknown: Yes    Social History Social History   Tobacco Use   Smoking status: Former    Types: Cigarettes    Quit date: 12/22/2008    Years since quitting: 13.7   Smokeless tobacco: Never  Vaping Use   Vaping Use: Never used  Substance Use Topics   Alcohol use: Not Currently    Alcohol/week: 0.0 standard drinks of alcohol   Drug use: No     Allergies   Trulicity [dulaglutide] and Lisinopril   Review of Systems Review of Systems  Constitutional:  Negative for fatigue and fever.  HENT:  Positive for ear pain and hearing loss. Negative for congestion and ear discharge.   Respiratory:  Negative for cough.   Neurological:  Negative for dizziness and headaches.     Physical Exam  Triage Vital Signs ED Triage Vitals  Enc Vitals Group     BP 09/29/22 1038 (!) 164/86     Pulse Rate 09/29/22 1038 (!) 59     Resp --      Temp 09/29/22 1038 98 F (36.7 C)     Temp Source 09/29/22 1038 Oral     SpO2 09/29/22 1038 96 %     Weight 09/29/22 1037 215 lb (97.5 kg)     Height 09/29/22 1037 5\' 11"  (1.803 m)     Head Circumference --      Peak Flow --      Pain Score 09/29/22 1037 6     Pain Loc --      Pain Edu? --      Excl. in GC? --    No data found.  Updated Vital Signs BP (!) 164/86 (BP Location: Left Arm)   Pulse (!) 59   Temp 98 F (36.7 C) (Oral)   Ht 5\' 11"  (1.803 m)   Wt 215 lb (97.5 kg)   SpO2 96%   BMI 29.99 kg/m      Physical  Exam Vitals and nursing note reviewed.  Constitutional:      General: He is not in acute distress.    Appearance: Normal appearance. He is well-developed. He is not ill-appearing.  HENT:     Head: Normocephalic and atraumatic.     Right Ear: Hearing and external ear normal. Drainage and swelling present.     Left Ear: Hearing, tympanic membrane, ear canal and external ear normal.     Ears:     Comments: Moderate swelling right EAC with yellowish debris--unable to visualize TM.    Nose: Nose normal.     Mouth/Throat:     Mouth: Mucous membranes are moist.     Pharynx: Oropharynx is clear.  Eyes:     Conjunctiva/sclera: Conjunctivae normal.  Cardiovascular:     Rate and Rhythm: Normal rate and regular rhythm.  Pulmonary:     Effort: Pulmonary effort is normal. No respiratory distress.  Musculoskeletal:     Cervical back: Neck supple.  Skin:    General: Skin is warm and dry.     Capillary Refill: Capillary refill takes less than 2 seconds.  Neurological:     General: No focal deficit present.     Mental Status: He is alert. Mental status is at baseline.     Motor: No weakness.     Gait: Gait normal.  Psychiatric:        Mood and Affect: Mood normal.        Behavior: Behavior normal.      UC Treatments / Results  Labs (all labs ordered are listed, but only abnormal results are displayed) Labs Reviewed - No data to display  EKG   Radiology No results found.  Procedures Procedures (including critical care time)  Medications Ordered in UC Medications - No data to display  Initial Impression / Assessment and Plan / UC Course  I have reviewed the triage vital signs and the nursing notes.  Pertinent labs & imaging results that were available during my care of the patient were reviewed by me and considered in my medical decision making (see chart for details).   62 year old male presents for 2-day history of right ear pressure/fullness, hearing loss.  Has used ear oil  without relief.  He thinks that made symptoms worse.  On exam he does have moderate swelling of the right EAC with  thick yellowish debris which may be consistent with cerumen versus exudates related to otitis externa.  Otic lavage ordered to be performed on the right ear.  Will reassess after otic lavage.  After lavage, medical assistant stated small amount of greenish-yellow debris was removed from the ear canal with flushing using hydroperoxide and warm water.  Canal remains swollen.  After patient gave consent, instilled a wick into the ear canal.  Will treat at this time for suspected otitis externa of the right ear.  Sent Ciprodex to pharmacy.  Reviewed supportive care, return and ER precautions.   Final Clinical Impressions(s) / UC Diagnoses   Final diagnoses:  Infective otitis externa of right ear  Impacted cerumen of right ear     Discharge Instructions      -We cleaned some wax out of her ear and also debris.  I am concerned that you have an infection in your ear canal.  Placed a wick in your ear to keep the canal open so the eardrops can go through and treat the condition.  It contains a corticosteroid to help with swelling and antibiotic to treat the infection.  As the swelling goes down the ear wick will fall out. - Whenever you are showering place a cottonball in your ear so that that it does not get wet. - You can have Tylenol or Motrin as needed for discomfort. - Symptoms should be improving over the next week.  However if they are not or if they are worsening, return for reevaluation.     ED Prescriptions     Medication Sig Dispense Auth. Provider   ciprofloxacin-dexamethasone (CIPRODEX) OTIC suspension Place 4 drops into the right ear 2 (two) times daily for 7 days. 7.5 mL Shirlee Latch, PA-C      PDMP not reviewed this encounter.   Shirlee Latch, PA-C 09/29/22 1147

## 2022-11-10 ENCOUNTER — Ambulatory Visit: Payer: BC Managed Care – PPO | Admitting: Family Medicine

## 2022-11-10 ENCOUNTER — Encounter: Payer: Self-pay | Admitting: Family Medicine

## 2022-11-10 VITALS — BP 154/80 | HR 52 | Temp 98.6°F | Wt 220.4 lb

## 2022-11-10 DIAGNOSIS — D692 Other nonthrombocytopenic purpura: Secondary | ICD-10-CM

## 2022-11-10 DIAGNOSIS — E785 Hyperlipidemia, unspecified: Secondary | ICD-10-CM

## 2022-11-10 DIAGNOSIS — E1142 Type 2 diabetes mellitus with diabetic polyneuropathy: Secondary | ICD-10-CM | POA: Diagnosis not present

## 2022-11-10 DIAGNOSIS — I10 Essential (primary) hypertension: Secondary | ICD-10-CM | POA: Diagnosis not present

## 2022-11-10 DIAGNOSIS — Z7984 Long term (current) use of oral hypoglycemic drugs: Secondary | ICD-10-CM

## 2022-11-10 LAB — BAYER DCA HB A1C WAIVED: HB A1C (BAYER DCA - WAIVED): 7.3 % — ABNORMAL HIGH (ref 4.8–5.6)

## 2022-11-10 MED ORDER — LOSARTAN POTASSIUM-HCTZ 100-25 MG PO TABS: 1.0000 | ORAL_TABLET | Freq: Every day | ORAL | 1 refills | Status: DC

## 2022-11-10 MED ORDER — CLINDAMYCIN PHOS-BENZOYL PEROX 1-5 % EX GEL: Freq: Two times a day (BID) | CUTANEOUS | 0 refills | Status: DC

## 2022-11-10 MED ORDER — PREGABALIN 75 MG PO CAPS: ORAL_CAPSULE | ORAL | 1 refills | Status: DC

## 2022-11-10 MED ORDER — HYDRALAZINE HCL 25 MG PO TABS
25.0000 mg | ORAL_TABLET | Freq: Two times a day (BID) | ORAL | 0 refills | Status: AC
Start: 2022-11-10 — End: ?

## 2022-11-10 MED ORDER — AMLODIPINE BESYLATE 5 MG PO TABS: 5.0000 mg | ORAL_TABLET | Freq: Every day | ORAL | 0 refills | Status: DC

## 2022-11-10 MED ORDER — SITAGLIPTIN PHOSPHATE 100 MG PO TABS: 100.0000 mg | ORAL_TABLET | Freq: Every day | ORAL | 1 refills | Status: DC

## 2022-11-10 MED ORDER — ROSUVASTATIN CALCIUM 5 MG PO TABS: 5.0000 mg | ORAL_TABLET | Freq: Every day | ORAL | 1 refills | Status: DC

## 2022-11-10 MED ORDER — METFORMIN HCL ER 500 MG PO TB24: 1000.0000 mg | ORAL_TABLET | Freq: Two times a day (BID) | ORAL | 1 refills | Status: DC

## 2022-11-10 NOTE — Assessment & Plan Note (Signed)
Up slightly with A1c of 7.3 up from 6.7. Will work on diet and exercise and recheck in 3 months. Call with any concerns. Continue to monitor.

## 2022-11-10 NOTE — Assessment & Plan Note (Signed)
BP running high. Will add hydralazine and cut his amlodipine to 5mg . Recheck 3 months. Call with any concerns.

## 2022-11-10 NOTE — Assessment & Plan Note (Signed)
Stable.       - Continue to monitor

## 2022-11-10 NOTE — Progress Notes (Signed)
BP (!) 154/80   Pulse (!) 52   Temp 98.6 F (37 C) (Oral)   Wt 220 lb 6.4 oz (100 kg)   SpO2 97%   BMI 30.74 kg/m    Subjective:    Patient ID: Shawn Shaw, male    DOB: [birth year], 62 y.o.   MRN: [id number]  HPI: Shawn Shaw is a 62 y.o. male  Chief Complaint  Patient presents with   Diabetes   Hypertension   Hyperlipidemia   DIABETES Hypoglycemic episodes:no Polydipsia/polyuria: no Visual disturbance: no Chest pain: no Paresthesias: no Glucose Monitoring: yes  Accucheck frequency: occasionally Taking Insulin?: no Blood Pressure Monitoring: not checking Retinal Examination: Not up to Date Foot Exam: Up to Date Diabetic Education: Completed Pneumovax: Up to Date Influenza: Up to Date Aspirin: no  HYPERTENSION / HYPERLIPIDEMIA Satisfied with current treatment? yes Duration of hypertension: chronic BP monitoring frequency: not checking BP medication side effects: no Past BP meds: amlodipine, losartan, HCTZ Duration of hyperlipidemia: chronic Cholesterol medication side effects: no Cholesterol supplements: none Past cholesterol medications: crestor Medication compliance: excellent compliance Aspirin: no Recent stressors: no Recurrent headaches: no Visual changes: no Palpitations: no Dyspnea: no Chest pain: no Lower extremity edema: no Dizzy/lightheaded: no   Relevant past medical, surgical, family and social history reviewed and updated as indicated. Interim medical history since our last visit reviewed. Allergies and medications reviewed and updated.  Review of Systems  Constitutional: Negative.   Respiratory: Negative.    Cardiovascular: Negative.   Gastrointestinal: Negative.   Musculoskeletal: Negative.   Neurological: Negative.   Psychiatric/Behavioral: Negative.      Per HPI unless specifically indicated above     Objective:    BP (!) 154/80   Pulse (!) 52   Temp 98.6 F (37 C) (Oral)   Wt 220 lb 6.4 oz (100 kg)   SpO2 97%    BMI 30.74 kg/m   Wt Readings from Last 3 Encounters:  11/10/22 220 lb 6.4 oz (100 kg)  09/29/22 215 lb (97.5 kg)  05/05/22 223 lb 14.4 oz (101.6 kg)    Physical Exam Vitals and nursing note reviewed.  Constitutional:      General: He is not in acute distress.    Appearance: Normal appearance. He is not ill-appearing, toxic-appearing or diaphoretic.  HENT:     Head: Normocephalic and atraumatic.     Right Ear: External ear normal.     Left Ear: External ear normal.     Nose: Nose normal.     Mouth/Throat:     Mouth: Mucous membranes are moist.     Pharynx: Oropharynx is clear.  Eyes:     General: No scleral icterus.       Right eye: No discharge.        Left eye: No discharge.     Extraocular Movements: Extraocular movements intact.     Conjunctiva/sclera: Conjunctivae normal.     Pupils: Pupils are equal, round, and reactive to light.  Cardiovascular:     Rate and Rhythm: Normal rate and regular rhythm.     Pulses: Normal pulses.     Heart sounds: Normal heart sounds. No murmur heard.    No friction rub. No gallop.  Pulmonary:     Effort: Pulmonary effort is normal. No respiratory distress.     Breath sounds: Normal breath sounds. No stridor. No wheezing, rhonchi or rales.  Chest:     Chest wall: No tenderness.  Musculoskeletal:        General:  Normal range of motion.     Cervical back: Normal range of motion and neck supple.  Skin:    General: Skin is warm and dry.     Capillary Refill: Capillary refill takes less than 2 seconds.     Coloration: Skin is not jaundiced or pale.     Findings: No bruising, erythema, lesion or rash.  Neurological:     General: No focal deficit present.     Mental Status: He is alert and oriented to person, place, and time. Mental status is at baseline.  Psychiatric:        Mood and Affect: Mood normal.        Behavior: Behavior normal.        Thought Content: Thought content normal.        Judgment: Judgment normal.     Results  for orders placed or performed in visit on 11/10/22  Bayer DCA Hb A1c Waived  Result Value Ref Range   HB A1C (BAYER DCA - WAIVED) 7.3 (H) 4.8 - 5.6 %      Assessment & Plan:   Problem List Items Addressed This Visit       Cardiovascular and Mediastinum   Hypertension    BP running high. Will add hydralazine and cut his amlodipine to 5mg . Recheck 3 months. Call with any concerns.       Relevant Medications   hydrALAZINE (APRESOLINE) 25 MG tablet   amLODipine (NORVASC) 5 MG tablet   losartan-hydrochlorothiazide (HYZAAR) 100-25 MG tablet   rosuvastatin (CRESTOR) 5 MG tablet   Other Relevant Orders   CBC with Differential/Platelet   Comprehensive metabolic panel   Senile purpura (HCC)    Stable. Continue to monitor.       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 Will work on diet and exercise and recheck in 3 months. Call with any concerns. Continue to monitor.       Relevant Medications   losartan-hydrochlorothiazide (HYZAAR) 100-25 MG tablet   metFORMIN (GLUCOPHAGE-XR) 500 MG 24 hr tablet   pregabalin (LYRICA) 75 MG capsule   rosuvastatin (CRESTOR) 5 MG tablet   sitaGLIPtin (JANUVIA) 100 MG tablet   Other Relevant Orders   Bayer DCA Hb A1c Waived (Completed)   CBC with Differential/Platelet   Comprehensive metabolic panel   Ambulatory referral to Ophthalmology     Other   Hyperlipidemia    Under good control on current regimen. Continue current regimen. Continue to monitor. Call with any concerns. Refills given. Labs drawn today.        Relevant Medications   hydrALAZINE (APRESOLINE) 25 MG tablet   amLODipine (NORVASC) 5 MG tablet   losartan-hydrochlorothiazide (HYZAAR) 100-25 MG tablet    rosuvastatin (CRESTOR) 5 MG tablet   Other Relevant Orders   CBC with Differential/Platelet   Comprehensive metabolic panel   Lipid Panel w/o Chol/HDL Ratio     Follow up plan: Return in about 3 months (around 02/10/2023).

## 2022-11-10 NOTE — Assessment & Plan Note (Signed)
Under good control on current regimen. Continue current regimen. Continue to monitor. Call with any concerns. Refills given. Labs drawn today.   

## 2022-11-11 LAB — COMPREHENSIVE METABOLIC PANEL
ALT: 22 IU/L (ref 0–44)
AST: 17 IU/L (ref 0–40)
Albumin: 4.5 g/dL (ref 3.9–4.9)
Alkaline Phosphatase: 66 IU/L (ref 44–121)
BUN/Creatinine Ratio: 18 (ref 10–24)
BUN: 21 mg/dL (ref 8–27)
Bilirubin Total: 0.3 mg/dL (ref 0.0–1.2)
CO2: 22 mmol/L (ref 20–29)
Calcium: 9.8 mg/dL (ref 8.6–10.2)
Chloride: 102 mmol/L (ref 96–106)
Creatinine, Ser: 1.17 mg/dL (ref 0.76–1.27)
Globulin, Total: 2.5 g/dL (ref 1.5–4.5)
Glucose: 114 mg/dL — ABNORMAL HIGH (ref 70–99)
Potassium: 4.5 mmol/L (ref 3.5–5.2)
Sodium: 140 mmol/L (ref 134–144)
Total Protein: 7 g/dL (ref 6.0–8.5)
eGFR: 70 mL/min/{1.73_m2} (ref >59)

## 2022-11-11 LAB — CBC WITH DIFFERENTIAL/PLATELET
Basophils Absolute: 0.1 10*3/uL (ref 0.0–0.2)
Basos: 1 %
EOS (ABSOLUTE): 0.2 10*3/uL (ref 0.0–0.4)
Eos: 2 %
Hematocrit: 43.9 % (ref 37.5–51.0)
Hemoglobin: 14.3 g/dL (ref 13.0–17.7)
Immature Grans (Abs): 0 10*3/uL (ref 0.0–0.1)
Immature Granulocytes: 1 %
Lymphocytes Absolute: 1.8 10*3/uL (ref 0.7–3.1)
Lymphs: 21 %
MCH: 28.9 pg (ref 26.6–33.0)
MCHC: 32.6 g/dL (ref 31.5–35.7)
MCV: 89 fL (ref 79–97)
Monocytes Absolute: 0.7 10*3/uL (ref 0.1–0.9)
Monocytes: 8 %
Neutrophils Absolute: 5.9 10*3/uL (ref 1.4–7.0)
Neutrophils: 67 %
Platelets: 375 10*3/uL (ref 150–450)
RBC: 4.95 x10E6/uL (ref 4.14–5.80)
RDW: 13.4 % (ref 11.6–15.4)
WBC: 8.7 10*3/uL (ref 3.4–10.8)

## 2022-11-11 LAB — LIPID PANEL W/O CHOL/HDL RATIO
Cholesterol, Total: 125 mg/dL (ref 100–199)
HDL: 37 mg/dL — ABNORMAL LOW (ref >39)
LDL Chol Calc (NIH): 61 mg/dL (ref 0–99)
Triglycerides: 156 mg/dL — ABNORMAL HIGH (ref 0–149)
VLDL Cholesterol Cal: 27 mg/dL (ref 5–40)

## 2022-11-14 LAB — HM DIABETES EYE EXAM

## 2022-11-20 ENCOUNTER — Encounter: Payer: Self-pay | Admitting: Family Medicine

## 2022-11-22 ENCOUNTER — Encounter: Payer: Self-pay | Admitting: Family Medicine

## 2022-11-22 ENCOUNTER — Other Ambulatory Visit: Payer: Self-pay | Admitting: Family Medicine

## 2022-12-12 LAB — HM COLONOSCOPY

## 2022-12-13 ENCOUNTER — Encounter: Payer: Self-pay | Admitting: Family Medicine

## 2022-12-27 LAB — HM COLONOSCOPY

## 2023-01-03 ENCOUNTER — Encounter: Payer: Self-pay | Admitting: Family Medicine

## 2023-01-29 ENCOUNTER — Other Ambulatory Visit: Payer: Self-pay | Admitting: Family Medicine

## 2023-01-30 NOTE — Telephone Encounter (Signed)
Requested medication (s) are due for refill today:  yes   Requested medication (s) are on the active medication list: yes   Last refill:  11/10/22 #180 0 refills  Future visit scheduled: yes in 1 week  Notes to clinic:  no refills remain. Do you want to refill Rx?     Requested Prescriptions  Pending Prescriptions Disp Refills   hydrALAZINE (APRESOLINE) 25 MG tablet [Pharmacy Med Name: HYDRALAZINE 25 MG TABLET] 180 tablet 0    Sig: Take 1 tablet (25 mg total) by mouth 2 (two) times daily.     Cardiovascular:  Vasodilators Failed - 01/29/2023 11:43 AM      Failed - ANA Screen, Ifa, Serum in normal range and within 360 days    No results found for: "ANA", "ANATITER", "LABANTI"       Failed - Last BP in normal range    BP Readings from Last 1 Encounters:  11/10/22 (!) 154/80         Passed - HCT in normal range and within 360 days    Hematocrit  Date Value Ref Range Status  11/10/2022 43.9 37.5 - 51.0 % Final         Passed - HGB in normal range and within 360 days    Hemoglobin  Date Value Ref Range Status  11/10/2022 14.3 13.0 - 17.7 g/dL Final         Passed - RBC in normal range and within 360 days    RBC  Date Value Ref Range Status  11/10/2022 4.95 4.14 - 5.80 x10E6/uL Final         Passed - WBC in normal range and within 360 days    WBC  Date Value Ref Range Status  11/10/2022 8.7 3.4 - 10.8 x10E3/uL Final         Passed - PLT in normal range and within 360 days    Platelets  Date Value Ref Range Status  11/10/2022 375 150 - 450 x10E3/uL Final         Passed - Valid encounter within last 12 months    Recent Outpatient Visits           2 months ago DM type 2 with diabetic peripheral neuropathy (HCC)   [place] [facility], Megan P, DO   9 months ago Routine general medical examination at a health care facility   [facility], Megan P, DO   12 months ago DM type 2 with diabetic peripheral  neuropathy [facility])   [place] [facility] [place], Megan P, DO   1 year ago DM type 2 with diabetic peripheral neuropathy [facility])   [place] [place], Megan P, DO   1 year ago DM type 2 with diabetic peripheral neuropathy [facility])   [place] [facility] [place], Oralia Rud, DO       Future Appointments             In 1 week Laural Benes, Oralia Rud, DO [facility], [facility]

## 2023-02-06 ENCOUNTER — Other Ambulatory Visit: Payer: Self-pay | Admitting: Family Medicine

## 2023-02-07 NOTE — Telephone Encounter (Signed)
Duplicate request, medication request is too soon.  Requested Prescriptions  Pending Prescriptions Disp Refills   hydrALAZINE (APRESOLINE) 25 MG tablet [Pharmacy Med Name: HYDRALAZINE 25 MG TABLET] 180 tablet 0    Sig: Take 1 tablet (25 mg total) by mouth 2 (two) times daily.     Cardiovascular:  Vasodilators Failed - 02/06/2023  5:22 PM      Failed - ANA Screen, Ifa, Serum in normal range and within 360 days    No results found for: "ANA", "ANATITER", "LABANTI"       Failed - Last BP in normal range    BP Readings from Last 1 Encounters:  11/10/22 (!) 154/80         Passed - HCT in normal range and within 360 days    Hematocrit  Date Value Ref Range Status  11/10/2022 43.9 37.5 - 51.0 % Final         Passed - HGB in normal range and within 360 days    Hemoglobin  Date Value Ref Range Status  11/10/2022 14.3 13.0 - 17.7 g/dL Final         Passed - RBC in normal range and within 360 days    RBC  Date Value Ref Range Status  11/10/2022 4.95 4.14 - 5.80 x10E6/uL Final         Passed - WBC in normal range and within 360 days    WBC  Date Value Ref Range Status  11/10/2022 8.7 3.4 - 10.8 x10E3/uL Final         Passed - PLT in normal range and within 360 days    Platelets  Date Value Ref Range Status  11/10/2022 375 150 - 450 x10E3/uL Final         Passed - Valid encounter within last 12 months    Recent Outpatient Visits           2 months ago DM type 2 with diabetic peripheral neuropathy (HCC)   [place] [facility] [place], Megan P, DO   9 months ago Routine general medical examination at a health care facility   [facility] [place], [place] P, DO   1 year ago DM type 2 with diabetic peripheral neuropathy [facility])   [place] [facility] [place], Megan P, DO   1 year ago DM type 2 with diabetic peripheral neuropathy [facility])   [place] [facility] [place], Megan P, DO   1 year ago DM type 2 with  diabetic peripheral neuropathy [facility])   [place] [facility] [place], Oralia Rud, DO       Future Appointments             In 5 days Dorcas Carrow, DO [place] [facility], [facility]

## 2023-02-12 ENCOUNTER — Ambulatory Visit: Payer: BC Managed Care – PPO | Admitting: Family Medicine

## 2023-02-12 ENCOUNTER — Encounter: Payer: Self-pay | Admitting: Family Medicine

## 2023-02-12 VITALS — BP 158/82 | HR 52 | Temp 98.1°F

## 2023-02-12 DIAGNOSIS — E785 Hyperlipidemia, unspecified: Secondary | ICD-10-CM | POA: Diagnosis not present

## 2023-02-12 DIAGNOSIS — E1142 Type 2 diabetes mellitus with diabetic polyneuropathy: Secondary | ICD-10-CM | POA: Diagnosis not present

## 2023-02-12 DIAGNOSIS — N138 Other obstructive and reflux uropathy: Secondary | ICD-10-CM

## 2023-02-12 DIAGNOSIS — I10 Essential (primary) hypertension: Secondary | ICD-10-CM

## 2023-02-12 DIAGNOSIS — D692 Other nonthrombocytopenic purpura: Secondary | ICD-10-CM

## 2023-02-12 DIAGNOSIS — Z7984 Long term (current) use of oral hypoglycemic drugs: Secondary | ICD-10-CM

## 2023-02-12 DIAGNOSIS — Z23 Encounter for immunization: Secondary | ICD-10-CM | POA: Diagnosis not present

## 2023-02-12 DIAGNOSIS — N401 Enlarged prostate with lower urinary tract symptoms: Secondary | ICD-10-CM

## 2023-02-12 DIAGNOSIS — K429 Umbilical hernia without obstruction or gangrene: Secondary | ICD-10-CM

## 2023-02-12 DIAGNOSIS — F419 Anxiety disorder, unspecified: Secondary | ICD-10-CM | POA: Diagnosis not present

## 2023-02-12 DIAGNOSIS — R0602 Shortness of breath: Secondary | ICD-10-CM

## 2023-02-12 MED ORDER — HYDRALAZINE HCL 25 MG PO TABS: 25.0000 mg | ORAL_TABLET | Freq: Two times a day (BID) | ORAL | 0 refills | Status: DC

## 2023-02-12 MED ORDER — LOSARTAN POTASSIUM-HCTZ 100-25 MG PO TABS: 1.0000 | ORAL_TABLET | Freq: Every day | ORAL | 1 refills | Status: DC

## 2023-02-12 MED ORDER — SITAGLIPTIN PHOSPHATE 100 MG PO TABS
100.0000 mg | ORAL_TABLET | Freq: Every day | ORAL | 1 refills | Status: DC
Start: 2023-02-12 — End: 2023-08-17

## 2023-02-12 MED ORDER — BUDESONIDE-FORMOTEROL FUMARATE 160-4.5 MCG/ACT IN AERO: 2.0000 | INHALATION_SPRAY | Freq: Two times a day (BID) | RESPIRATORY_TRACT | 3 refills | Status: DC

## 2023-02-12 MED ORDER — METFORMIN HCL ER 500 MG PO TB24: 1000.0000 mg | ORAL_TABLET | Freq: Two times a day (BID) | ORAL | 1 refills | Status: DC

## 2023-02-12 MED ORDER — ROSUVASTATIN CALCIUM 5 MG PO TABS: 5.0000 mg | ORAL_TABLET | Freq: Every day | ORAL | 1 refills | Status: DC

## 2023-02-12 MED ORDER — PREGABALIN 75 MG PO CAPS: ORAL_CAPSULE | ORAL | 1 refills | Status: DC

## 2023-02-12 NOTE — Progress Notes (Signed)
BP (!) 158/82   Pulse (!) 52   Temp 98.1 F (36.7 C) (Oral)   SpO2 99%    Subjective:    Patient ID: Shawn Shaw, male    DOB: 06-05-1960, 62 y.o.   MRN: [id number]  HPI: Shawn Shaw is a 62 y.o. male  Chief Complaint  Patient presents with   Diabetes   Hypertension   DIABETES Hypoglycemic episodes:no Polydipsia/polyuria: no Visual disturbance: no Chest pain: no Paresthesias: yes L hand Glucose Monitoring: no  Accucheck frequency: Not Checking Taking Insulin?: no Blood Pressure Monitoring: not checking Retinal Examination: Up to Date Foot Exam: Up to Date Diabetic Education: Completed Pneumovax: Up to Date Influenza: Up to Date Aspirin: no  HYPERTENSION / HYPERLIPIDEMIA Satisfied with current treatment? yes Duration of hypertension: chronic BP monitoring frequency: a few times a month BP range: 120s/70s BP medication side effects: fatigue Past BP meds: losartan, hydrochlorothiazide, hydralazine Duration of hyperlipidemia: chronic Cholesterol medication side effects: no Cholesterol supplements: none Past cholesterol medications: crestor Medication compliance: excellent compliance Aspirin: no Recent stressors: no Recurrent headaches: no Visual changes: no Palpitations: no Dyspnea: yes Chest pain: no Lower extremity edema: no Dizzy/lightheaded: no  Relevant past medical, surgical, family and social history reviewed and updated as indicated. Interim medical history since our last visit reviewed. Allergies and medications reviewed and updated.  Review of Systems  Constitutional: Negative.   Respiratory:  Positive for shortness of breath. Negative for apnea, cough, choking, chest tightness, wheezing and stridor.   Cardiovascular: Negative.   Gastrointestinal: Negative.   Musculoskeletal: Negative.   Psychiatric/Behavioral: Negative.      Per HPI unless specifically indicated above     Objective:    BP (!) 158/82   Pulse (!) 52   Temp 98.1 F  (36.7 C) (Oral)   SpO2 99%   Wt Readings from Last 3 Encounters:  11/10/22 220 lb 6.4 oz (100 kg)  09/29/22 215 lb (97.5 kg)  05/05/22 223 lb 14.4 oz (101.6 kg)    Physical Exam Vitals and nursing note reviewed.  Constitutional:      General: He is not in acute distress.    Appearance: Normal appearance. He is obese. He is not ill-appearing, toxic-appearing or diaphoretic.  HENT:     Head: Normocephalic and atraumatic.     Right Ear: External ear normal.     Left Ear: External ear normal.     Nose: Nose normal.     Mouth/Throat:     Mouth: Mucous membranes are moist.     Pharynx: Oropharynx is clear.  Eyes:     General: No scleral icterus.       Right eye: No discharge.        Left eye: No discharge.     Extraocular Movements: Extraocular movements intact.     Conjunctiva/sclera: Conjunctivae normal.     Pupils: Pupils are equal, round, and reactive to light.  Cardiovascular:     Rate and Rhythm: Normal rate and regular rhythm.     Pulses: Normal pulses.     Heart sounds: Normal heart sounds. No murmur heard.    No friction rub. No gallop.  Pulmonary:     Effort: Pulmonary effort is normal. No respiratory distress.     Breath sounds: Normal breath sounds. No stridor. No wheezing, rhonchi or rales.  Chest:     Chest wall: No tenderness.  Abdominal:     Hernia: A hernia (umbilical) is present.  Musculoskeletal:  General: Normal range of motion.     Cervical back: Normal range of motion and neck supple.  Skin:    General: Skin is warm and dry.     Capillary Refill: Capillary refill takes less than 2 seconds.     Coloration: Skin is not jaundiced or pale.     Findings: No bruising, erythema, lesion or rash.  Neurological:     General: No focal deficit present.     Mental Status: He is alert and oriented to person, place, and time. Mental status is at baseline.  Psychiatric:        Mood and Affect: Mood normal.        Behavior: Behavior normal.        Thought  Content: Thought content normal.        Judgment: Judgment normal.     Results for orders placed or performed in visit on 01/03/23  HM COLONOSCOPY  Result Value Ref Range   HM Colonoscopy See Report (in chart) See Report (in chart), Patient Reported      Assessment & Plan:   Problem List Items Addressed This Visit       Cardiovascular and Mediastinum   Hypertension    Has been running well at home, but high here. Will have him bring in his cuff to compare and treat BP accordingly. Await comparison. Labs checked today.      Relevant Medications   hydrALAZINE (APRESOLINE) 25 MG tablet   losartan-hydrochlorothiazide (HYZAAR) 100-25 MG tablet   rosuvastatin (CRESTOR) 5 MG tablet   Other Relevant Orders   CBC with Differential/Platelet   Comprehensive metabolic panel   Senile purpura (HCC)    Reassured patient. Continue to monitor.       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 Await results. Treat as needed.       Relevant Medications   losartan-hydrochlorothiazide (HYZAAR) 100-25 MG tablet   metFORMIN (GLUCOPHAGE-XR) 500 MG 24 hr tablet   pregabalin (LYRICA) 75 MG capsule   rosuvastatin (CRESTOR) 5 MG tablet   sitaGLIPtin (JANUVIA) 100 MG tablet   Other Relevant Orders   CBC with Differential/Platelet   Comprehensive metabolic panel   Hgb A1c w/o eAG     Genitourinary   BPH with obstruction/lower urinary tract symptoms    Under good control on current regimen. Continue current regimen. Continue to monitor. Call with any concerns. Refills given. Labs drawn today.       Relevant Orders   CBC with Differential/Platelet   Comprehensive metabolic panel     Other   Anxiety    Stable. Continue to monitor. Call with any concerns.       Relevant  Orders   CBC with Differential/Platelet   Comprehensive metabolic panel   Hyperlipidemia    Under good control on current regimen. Continue current regimen. Continue to monitor. Call with any concerns. Refills given. Labs drawn today.       Relevant Medications   hydrALAZINE (APRESOLINE) 25 MG tablet   losartan-hydrochlorothiazide (HYZAAR) 100-25 MG tablet   rosuvastatin (CRESTOR) 5 MG tablet   Other Relevant Orders   CBC with Differential/Platelet   Comprehensive metabolic panel   Lipid Panel w/o Chol/HDL Ratio   Other Visit Diagnoses     SOB (shortness of breath)  Cleda Daub OK. Will check labs and add in symbicort. If not better next time consider cardiac work up.   Relevant Orders   Spirometry with graph (Completed)   Umbilical hernia without obstruction and without gangrene       Starting to bother him- would like to see general surgery. Referral placed today.   Relevant Orders   Ambulatory referral to General Surgery   Need for influenza vaccination       Flu shot given today.   Relevant Orders   Flu vaccine trivalent PF, 6mos and older(Flulaval,Afluria,Fluarix,Fluzone) (Completed)        Follow up plan: Return in about 4 weeks (around 03/12/2023).

## 2023-02-12 NOTE — Assessment & Plan Note (Signed)
Has been running well at home, but high here. Will have him bring in his cuff to compare and treat BP accordingly. Await comparison. Labs checked today.

## 2023-02-12 NOTE — Assessment & Plan Note (Signed)
Rechecking labs today. Await results. Treat as needed.  °

## 2023-02-12 NOTE — Assessment & Plan Note (Signed)
Stable. Continue to monitor. Call with any concerns.  ?

## 2023-02-12 NOTE — Assessment & Plan Note (Signed)
Reassured patient. Continue to monitor.  

## 2023-02-12 NOTE — Assessment & Plan Note (Signed)
Under good control on current regimen. Continue current regimen. Continue to monitor. Call with any concerns. Refills given. Labs drawn today.   

## 2023-02-13 LAB — CBC WITH DIFFERENTIAL/PLATELET
Basophils Absolute: 0 10*3/uL (ref 0.0–0.2)
Basos: 1 %
EOS (ABSOLUTE): 0.2 10*3/uL (ref 0.0–0.4)
Eos: 3 %
Hematocrit: 43.1 % (ref 37.5–51.0)
Hemoglobin: 13.9 g/dL (ref 13.0–17.7)
Immature Grans (Abs): 0 10*3/uL (ref 0.0–0.1)
Immature Granulocytes: 0 %
Lymphocytes Absolute: 1.6 10*3/uL (ref 0.7–3.1)
Lymphs: 23 %
MCH: 29.8 pg (ref 26.6–33.0)
MCHC: 32.3 g/dL (ref 31.5–35.7)
MCV: 93 fL (ref 79–97)
Monocytes Absolute: 0.6 10*3/uL (ref 0.1–0.9)
Monocytes: 8 %
Neutrophils Absolute: 4.6 10*3/uL (ref 1.4–7.0)
Neutrophils: 65 %
Platelets: 287 10*3/uL (ref 150–450)
RBC: 4.66 x10E6/uL (ref 4.14–5.80)
RDW: 13.1 % (ref 11.6–15.4)
WBC: 7 10*3/uL (ref 3.4–10.8)

## 2023-02-13 LAB — COMPREHENSIVE METABOLIC PANEL
ALT: 21 [IU]/L (ref 0–44)
AST: 17 [IU]/L (ref 0–40)
Albumin: 4.2 g/dL (ref 3.9–4.9)
Alkaline Phosphatase: 65 [IU]/L (ref 44–121)
BUN/Creatinine Ratio: 20 (ref 10–24)
BUN: 23 mg/dL (ref 8–27)
Bilirubin Total: 0.3 mg/dL (ref 0.0–1.2)
CO2: 21 mmol/L (ref 20–29)
Calcium: 9.5 mg/dL (ref 8.6–10.2)
Chloride: 102 mmol/L (ref 96–106)
Creatinine, Ser: 1.17 mg/dL (ref 0.76–1.27)
Globulin, Total: 2.6 g/dL (ref 1.5–4.5)
Glucose: 124 mg/dL — ABNORMAL HIGH (ref 70–99)
Potassium: 4.5 mmol/L (ref 3.5–5.2)
Sodium: 138 mmol/L (ref 134–144)
Total Protein: 6.8 g/dL (ref 6.0–8.5)
eGFR: 70 mL/min/{1.73_m2} (ref >59)

## 2023-02-13 LAB — LIPID PANEL W/O CHOL/HDL RATIO
Cholesterol, Total: 132 mg/dL (ref 100–199)
HDL: 37 mg/dL — ABNORMAL LOW (ref >39)
LDL Chol Calc (NIH): 67 mg/dL (ref 0–99)
Triglycerides: 167 mg/dL — ABNORMAL HIGH (ref 0–149)
VLDL Cholesterol Cal: 28 mg/dL (ref 5–40)

## 2023-02-13 LAB — HGB A1C W/O EAG: Hgb A1c MFr Bld: 6.5 % — ABNORMAL HIGH (ref 4.8–5.6)

## 2023-03-12 ENCOUNTER — Encounter: Payer: Self-pay | Admitting: Family Medicine

## 2023-03-12 ENCOUNTER — Ambulatory Visit: Payer: BC Managed Care – PPO | Admitting: Family Medicine

## 2023-03-12 VITALS — BP 135/72 | HR 55 | Ht 71.0 in | Wt 217.2 lb

## 2023-03-12 DIAGNOSIS — I10 Essential (primary) hypertension: Secondary | ICD-10-CM | POA: Diagnosis not present

## 2023-03-12 MED ORDER — CLINDAMYCIN PHOS-BENZOYL PEROX 1-5 % EX GEL: Freq: Two times a day (BID) | CUTANEOUS | 0 refills | Status: DC

## 2023-03-12 NOTE — Progress Notes (Signed)
BP 135/72   Pulse (!) 55   Ht 5\' 11"  (1.803 m)   Wt 217 lb 3.2 oz (98.5 kg)   SpO2 98%   BMI 30.29 kg/m    Subjective:    Patient ID: Shawn Shaw, male    DOB: 1960-11-01, 62 y.o.   MRN: [id number]  HPI: Shawn Shaw is a 62 y.o. male  Chief Complaint  Patient presents with   Hyperlipidemia   Hypertension    Patient says this morning when he woke up, he noticed his BP was elevated. Patient says he noticed elevation in his BP readings in the mornings. Patient says this morning was 143. Patient says he has purchased a new machine since his last visit.    HYPERTENSION  Hypertension status: better  Satisfied with current treatment? yes Duration of hypertension: chronic BP monitoring frequency:  a few times a week BP medication side effects:  no Medication compliance: excellent compliance Previous BP meds:losartan, hydrochlorothiazide, hydralazine Aspirin: no Recurrent headaches: no Visual changes: no Palpitations: no Dyspnea: no Chest pain: no Lower extremity edema: no Dizzy/lightheaded: no  Breathing is better. Feeling well. No concerns.    Relevant past medical, surgical, family and social history reviewed and updated as indicated. Interim medical history since our last visit reviewed. Allergies and medications reviewed and updated.  Review of Systems  Constitutional: Negative.   Respiratory: Negative.    Cardiovascular: Negative.   Gastrointestinal: Negative.   Musculoskeletal: Negative.   Psychiatric/Behavioral: Negative.      Per HPI unless specifically indicated above     Objective:    BP 135/72   Pulse (!) 55   Ht 5\' 11"  (1.803 m)   Wt 217 lb 3.2 oz (98.5 kg)   SpO2 98%   BMI 30.29 kg/m   Wt Readings from Last 3 Encounters:  03/12/23 217 lb 3.2 oz (98.5 kg)  11/10/22 220 lb 6.4 oz (100 kg)  09/29/22 215 lb (97.5 kg)    Physical Exam Vitals and nursing note reviewed.  Constitutional:      General: He is not in acute distress.     Appearance: Normal appearance. He is obese. He is not ill-appearing, toxic-appearing or diaphoretic.  HENT:     Head: Normocephalic and atraumatic.     Right Ear: External ear normal.     Left Ear: External ear normal.     Nose: Nose normal.     Mouth/Throat:     Mouth: Mucous membranes are moist.     Pharynx: Oropharynx is clear.  Eyes:     General: No scleral icterus.       Right eye: No discharge.        Left eye: No discharge.     Extraocular Movements: Extraocular movements intact.     Conjunctiva/sclera: Conjunctivae normal.     Pupils: Pupils are equal, round, and reactive to light.  Cardiovascular:     Rate and Rhythm: Normal rate and regular rhythm.     Pulses: Normal pulses.     Heart sounds: Normal heart sounds. No murmur heard.    No friction rub. No gallop.  Pulmonary:     Effort: Pulmonary effort is normal. No respiratory distress.     Breath sounds: Normal breath sounds. No stridor. No wheezing, rhonchi or rales.  Chest:     Chest wall: No tenderness.  Musculoskeletal:        General: Normal range of motion.     Cervical back: Normal range of motion and neck  supple.  Skin:    General: Skin is warm and dry.     Capillary Refill: Capillary refill takes less than 2 seconds.     Coloration: Skin is not jaundiced or pale.     Findings: No bruising, erythema, lesion or rash.  Neurological:     General: No focal deficit present.     Mental Status: He is alert and oriented to person, place, and time. Mental status is at baseline.  Psychiatric:        Mood and Affect: Mood normal.        Behavior: Behavior normal.        Thought Content: Thought content normal.        Judgment: Judgment normal.     Results for orders placed or performed in visit on 02/12/23  CBC with Differential/Platelet  Result Value Ref Range   WBC 7.0 3.4 - 10.8 x10E3/uL   RBC 4.66 4.14 - 5.80 x10E6/uL   Hemoglobin 13.9 13.0 - 17.7 g/dL   Hematocrit 81.1 91.4 - 51.0 %   MCV 93 79 - 97 fL    MCH 29.8 26.6 - 33.0 pg   MCHC 32.3 31.5 - 35.7 g/dL   RDW 78.2 95.6 - 21.3 %   Platelets 287 150 - 450 x10E3/uL   Neutrophils 65 Not Estab. %   Lymphs 23 Not Estab. %   Monocytes 8 Not Estab. %   Eos 3 Not Estab. %   Basos 1 Not Estab. %   Neutrophils Absolute 4.6 1.4 - 7.0 x10E3/uL   Lymphocytes Absolute 1.6 0.7 - 3.1 x10E3/uL   Monocytes Absolute 0.6 0.1 - 0.9 x10E3/uL   EOS (ABSOLUTE) 0.2 0.0 - 0.4 x10E3/uL   Basophils Absolute 0.0 0.0 - 0.2 x10E3/uL   Immature Granulocytes 0 Not Estab. %   Immature Grans (Abs) 0.0 0.0 - 0.1 x10E3/uL  Comprehensive metabolic panel  Result Value Ref Range   Glucose 124 (H) 70 - 99 mg/dL   BUN 23 8 - 27 mg/dL   Creatinine, Ser 0.86 0.76 - 1.27 mg/dL   eGFR 70 >57 QI/ONG/2.95   BUN/Creatinine Ratio 20 10 - 24   Sodium 138 134 - 144 mmol/L   Potassium 4.5 3.5 - 5.2 mmol/L   Chloride 102 96 - 106 mmol/L   CO2 21 20 - 29 mmol/L   Calcium 9.5 8.6 - 10.2 mg/dL   Total Protein 6.8 6.0 - 8.5 g/dL   Albumin 4.2 3.9 - 4.9 g/dL   Globulin, Total 2.6 1.5 - 4.5 g/dL   Bilirubin Total 0.3 0.0 - 1.2 mg/dL   Alkaline Phosphatase 65 44 - 121 IU/L   AST 17 0 - 40 IU/L   ALT 21 0 - 44 IU/L  Hgb A1c w/o eAG  Result Value Ref Range   Hgb A1c MFr Bld 6.5 (H) 4.8 - 5.6 %  Lipid Panel w/o Chol/HDL Ratio  Result Value Ref Range   Cholesterol, Total 132 100 - 199 mg/dL   Triglycerides 284 (H) 0 - 149 mg/dL   HDL 37 (L) >13 mg/dL   VLDL Cholesterol Cal 28 5 - 40 mg/dL   LDL Chol Calc (NIH) 67 0 - 99 mg/dL      Assessment & Plan:   Problem List Items Addressed This Visit       Cardiovascular and Mediastinum   Hypertension - Primary    Under good control on current regimen. Continue current regimen. Continue to monitor. Call with any concerns. Refills up to date.  Follow up plan: Return in about 2 months (around 05/12/2023).

## 2023-03-12 NOTE — Assessment & Plan Note (Signed)
Under good control on current regimen. Continue current regimen. Continue to monitor. Call with any concerns. Refills up to date.   

## 2023-05-11 ENCOUNTER — Encounter: Payer: Self-pay | Admitting: Family Medicine

## 2023-05-11 ENCOUNTER — Ambulatory Visit: Payer: BC Managed Care – PPO | Admitting: Family Medicine

## 2023-05-11 VITALS — BP 153/83 | HR 59 | Temp 98.3°F | Ht 71.26 in | Wt 222.6 lb

## 2023-05-11 DIAGNOSIS — I10 Essential (primary) hypertension: Secondary | ICD-10-CM

## 2023-05-11 DIAGNOSIS — D692 Other nonthrombocytopenic purpura: Secondary | ICD-10-CM

## 2023-05-11 DIAGNOSIS — E785 Hyperlipidemia, unspecified: Secondary | ICD-10-CM | POA: Diagnosis not present

## 2023-05-11 DIAGNOSIS — Z0000 Encounter for general adult medical examination without abnormal findings: Secondary | ICD-10-CM

## 2023-05-11 DIAGNOSIS — E1142 Type 2 diabetes mellitus with diabetic polyneuropathy: Secondary | ICD-10-CM | POA: Diagnosis not present

## 2023-05-11 DIAGNOSIS — N401 Enlarged prostate with lower urinary tract symptoms: Secondary | ICD-10-CM | POA: Diagnosis not present

## 2023-05-11 DIAGNOSIS — N138 Other obstructive and reflux uropathy: Secondary | ICD-10-CM

## 2023-05-11 DIAGNOSIS — Z7984 Long term (current) use of oral hypoglycemic drugs: Secondary | ICD-10-CM

## 2023-05-11 LAB — MICROALBUMIN, URINE WAIVED
Creatinine, Urine Waived: 200 mg/dL (ref 10–300)
Microalb, Ur Waived: 30 mg/L — ABNORMAL HIGH (ref 0–19)
Microalb/Creat Ratio: <30 mg/g (ref <30)

## 2023-05-11 LAB — BAYER DCA HB A1C WAIVED: HB A1C (BAYER DCA - WAIVED): 6.8 % — ABNORMAL HIGH (ref 4.8–5.6)

## 2023-05-11 MED ORDER — BUDESONIDE-FORMOTEROL FUMARATE 160-4.5 MCG/ACT IN AERO: 2.0000 | INHALATION_SPRAY | Freq: Two times a day (BID) | RESPIRATORY_TRACT | 3 refills | Status: DC

## 2023-05-11 MED ORDER — HYDRALAZINE HCL 50 MG PO TABS: 50.0000 mg | ORAL_TABLET | Freq: Two times a day (BID) | ORAL | 0 refills | Status: DC

## 2023-05-11 MED ORDER — CLINDAMYCIN PHOS-BENZOYL PEROX 1-5 % EX GEL: Freq: Two times a day (BID) | CUTANEOUS | 0 refills | Status: DC

## 2023-05-11 NOTE — Assessment & Plan Note (Signed)
Not under good control. Will increase his hydralazine to 50mg  and recheck in 3 months. Call with any concerns.

## 2023-05-11 NOTE — Assessment & Plan Note (Signed)
Under good control on current regimen. Continue current regimen. Continue to monitor. Call with any concerns. Refills given. Labs drawn today.   

## 2023-05-11 NOTE — Assessment & Plan Note (Signed)
Reassured patient. Continue to monitor.  

## 2023-05-11 NOTE — Progress Notes (Signed)
BP (!) 153/83 (BP Location: Left Arm, Patient Position: Sitting, Cuff Size: Large)   Pulse (!) 59   Temp 98.3 F (36.8 C) (Oral)   Ht 5' 11.26" (1.81 m)   Wt 222 lb 9.6 oz (101 kg)   SpO2 98%   BMI 30.82 kg/m    Subjective:    Patient ID: Shawn Shaw, male    DOB: Nov 02, 1960, 62 y.o.   MRN: [id number]  HPI: Shawn Shaw is a 62 y.o. male presenting on 05/11/2023 for comprehensive medical examination. Current medical complaints include:  DIABETES Hypoglycemic episodes:no Polydipsia/polyuria: no Visual disturbance: no Chest pain: no Paresthesias: no Glucose Monitoring: yes  Accucheck frequency: Daily Taking Insulin?: no Blood Pressure Monitoring: not checking Retinal Examination: Up to Date Foot Exam: Up to Date Diabetic Education: Completed Pneumovax: Up to Date Influenza: Up to Date Aspirin: no  HYPERTENSION / HYPERLIPIDEMIA Satisfied with current treatment? no Duration of hypertension: chronic BP monitoring frequency: not checking BP medication side effects: no Past BP meds: hydralazine, losartan, HCTZ Duration of hyperlipidemia: chronic Cholesterol medication side effects: no Cholesterol supplements: none Past cholesterol medications: crestor Medication compliance: excellent compliance Aspirin: no Recent stressors: no Recurrent headaches: no Visual changes: no Palpitations: no Dyspnea: no Chest pain: no Lower extremity edema: no Dizzy/lightheaded: no  He currently lives with: wife Interim Problems from his last visit: no  Depression Screen done today and results listed below:     05/11/2023   10:22 AM 03/12/2023   10:06 AM 02/12/2023    9:44 AM 11/10/2022    9:38 AM 05/05/2022   10:25 AM  Depression screen PHQ 2/9  Decreased Interest 0 0 0 0 3  Down, Depressed, Hopeless 0 0 0 0 3  PHQ - 2 Score 0 0 0 0 6  Altered sleeping 0  0 0 0  Tired, decreased energy 0  0 3 3  Change in appetite 0  0 2 0  Feeling bad or failure about yourself  0  0 0  0  Trouble concentrating 0  0 0 0  Moving slowly or fidgety/restless 0  0 0 2  Suicidal thoughts 0  0 0 0  PHQ-9 Score 0  0 5 11  Difficult doing work/chores   Not difficult at all Not difficult at all Somewhat difficult    Past Medical History:  Past Medical History:  Diagnosis Date   Anxiety    Depression    Diabetes mellitus without complication (HCC)    Hyperlipidemia    Hypertension     Surgical History:  Past Surgical History:  Procedure Laterality Date   PTERYGIUM EXCISION Right     Medications:  Current Outpatient Medications on File Prior to Visit  Medication Sig   finasteride (PROSCAR) 5 MG tablet 5 mg daily.   losartan-hydrochlorothiazide (HYZAAR) 100-25 MG tablet Take 1 tablet by mouth daily.   metFORMIN (GLUCOPHAGE-XR) 500 MG 24 hr tablet Take 2 tablets (1,000 mg total) by mouth in the morning and at bedtime.   pregabalin (LYRICA) 75 MG capsule Take 1 in the AM and 2 in the PM   rosuvastatin (CRESTOR) 5 MG tablet Take 1 tablet (5 mg total) by mouth daily.   sildenafil (REVATIO) 20 MG tablet Take 20 mg by mouth daily as needed.   sitaGLIPtin (JANUVIA) 100 MG tablet Take 1 tablet (100 mg total) by mouth daily.   triamcinolone cream (KENALOG) 0.1 % Apply 1 application topically 2 (two) times daily.   No current facility-administered medications on file  prior to visit.    Allergies:  Allergies  Allergen Reactions   Trulicity [Dulaglutide] Rash   Lisinopril Cough    Social History:  Social History   Socioeconomic History   Marital status: Unknown    Spouse name: Not on file   Number of children: Not on file   Years of education: Not on file   Highest education level: Not on file  Occupational History   Not on file  Tobacco Use   Smoking status: Former    Current packs/day: 0.00    Types: Cigarettes    Quit date: 12/22/2008    Years since quitting: 14.3   Smokeless tobacco: Never  Vaping Use   Vaping status: Never Used  Substance and Sexual  Activity   Alcohol use: Not Currently    Alcohol/week: 0.0 standard drinks of alcohol   Drug use: No   Sexual activity: Not Currently  Other Topics Concern   Not on file  Social History Narrative   Not on file   Social Drivers of Health   Financial Resource Strain: Not on file  Food Insecurity: Not on file  Transportation Needs: Not on file  Physical Activity: Not on file  Stress: Not on file  Social Connections: Not on file  Intimate Partner Violence: Not on file   Social History   Tobacco Use  Smoking Status Former   Current packs/day: 0.00   Types: Cigarettes   Quit date: 12/22/2008   Years since quitting: 14.3  Smokeless Tobacco Never   Social History   Substance and Sexual Activity  Alcohol Use Not Currently   Alcohol/week: 0.0 standard drinks of alcohol    Family History:  Family History  Family history unknown: Yes    Past medical history, surgical history, medications, allergies, family history and social history reviewed with patient today and changes made to appropriate areas of the chart.   Review of Systems  Constitutional:  Positive for diaphoresis. Negative for chills, fever, malaise/fatigue and weight loss.  HENT: Negative.         Dry ears  Eyes: Negative.   Respiratory: Negative.    Cardiovascular: Negative.   Gastrointestinal:  Positive for diarrhea and heartburn. Negative for abdominal pain, blood in stool, constipation, melena, nausea and vomiting.  Genitourinary:  Positive for dysuria. Negative for flank pain, frequency, hematuria and urgency.  Musculoskeletal: Negative.   Skin: Negative.   Neurological:  Positive for tingling. Negative for dizziness, tremors, sensory change, speech change, focal weakness, seizures, loss of consciousness, weakness and headaches.  Endo/Heme/Allergies:  Positive for environmental allergies. Negative for polydipsia. Bruises/bleeds easily.  Psychiatric/Behavioral: Negative.     All other ROS negative except  what is listed above and in the HPI.      Objective:    BP (!) 153/83 (BP Location: Left Arm, Patient Position: Sitting, Cuff Size: Large)   Pulse (!) 59   Temp 98.3 F (36.8 C) (Oral)   Ht 5' 11.26" (1.81 m)   Wt 222 lb 9.6 oz (101 kg)   SpO2 98%   BMI 30.82 kg/m   Wt Readings from Last 3 Encounters:  05/11/23 222 lb 9.6 oz (101 kg)  03/12/23 217 lb 3.2 oz (98.5 kg)  11/10/22 220 lb 6.4 oz (100 kg)    Physical Exam Vitals and nursing note reviewed.  Constitutional:      General: He is not in acute distress.    Appearance: Normal appearance. He is not ill-appearing, toxic-appearing or diaphoretic.  HENT:  Head: Normocephalic and atraumatic.     Right Ear: Tympanic membrane, ear canal and external ear normal. There is no impacted cerumen.     Left Ear: Tympanic membrane, ear canal and external ear normal. There is no impacted cerumen.     Nose: Nose normal. No congestion or rhinorrhea.     Mouth/Throat:     Mouth: Mucous membranes are moist.     Pharynx: Oropharynx is clear. No oropharyngeal exudate or posterior oropharyngeal erythema.  Eyes:     General: No scleral icterus.       Right eye: No discharge.        Left eye: No discharge.     Extraocular Movements: Extraocular movements intact.     Conjunctiva/sclera: Conjunctivae normal.     Pupils: Pupils are equal, round, and reactive to light.  Neck:     Vascular: No carotid bruit.  Cardiovascular:     Rate and Rhythm: Normal rate and regular rhythm.     Pulses: Normal pulses.     Heart sounds: No murmur heard.    No friction rub. No gallop.  Pulmonary:     Effort: Pulmonary effort is normal. No respiratory distress.     Breath sounds: Normal breath sounds. No stridor. No wheezing, rhonchi or rales.  Chest:     Chest wall: No tenderness.  Abdominal:     General: Abdomen is flat. Bowel sounds are normal. There is no distension.     Palpations: Abdomen is soft. There is no mass.     Tenderness: There is no  abdominal tenderness. There is no right CVA tenderness, left CVA tenderness, guarding or rebound.     Hernia: No hernia is present.  Genitourinary:    Comments: Genital exam deferred with shared decision making Musculoskeletal:        General: No swelling, tenderness, deformity or signs of injury.     Cervical back: Normal range of motion and neck supple. No rigidity. No muscular tenderness.     Right lower leg: No edema.     Left lower leg: No edema.  Lymphadenopathy:     Cervical: No cervical adenopathy.  Skin:    General: Skin is warm and dry.     Capillary Refill: Capillary refill takes less than 2 seconds.     Coloration: Skin is not jaundiced or pale.     Findings: No bruising, erythema, lesion or rash.  Neurological:     General: No focal deficit present.     Mental Status: He is alert and oriented to person, place, and time.     Cranial Nerves: No cranial nerve deficit.     Sensory: No sensory deficit.     Motor: No weakness.     Coordination: Coordination normal.     Gait: Gait normal.     Deep Tendon Reflexes: Reflexes normal.  Psychiatric:        Mood and Affect: Mood normal.        Behavior: Behavior normal.        Thought Content: Thought content normal.        Judgment: Judgment normal.     Results for orders placed or performed in visit on 02/12/23  CBC with Differential/Platelet   Collection Time: 02/12/23  9:39 AM  Result Value Ref Range   WBC 7.0 3.4 - 10.8 x10E3/uL   RBC 4.66 4.14 - 5.80 x10E6/uL   Hemoglobin 13.9 13.0 - 17.7 g/dL   Hematocrit 47.8 29.5 - 51.0 %  MCV 93 79 - 97 fL   MCH 29.8 26.6 - 33.0 pg   MCHC 32.3 31.5 - 35.7 g/dL   RDW 82.9 56.2 - 13.0 %   Platelets 287 150 - 450 x10E3/uL   Neutrophils 65 Not Estab. %   Lymphs 23 Not Estab. %   Monocytes 8 Not Estab. %   Eos 3 Not Estab. %   Basos 1 Not Estab. %   Neutrophils Absolute 4.6 1.4 - 7.0 x10E3/uL   Lymphocytes Absolute 1.6 0.7 - 3.1 x10E3/uL   Monocytes Absolute 0.6 0.1 - 0.9  x10E3/uL   EOS (ABSOLUTE) 0.2 0.0 - 0.4 x10E3/uL   Basophils Absolute 0.0 0.0 - 0.2 x10E3/uL   Immature Granulocytes 0 Not Estab. %   Immature Grans (Abs) 0.0 0.0 - 0.1 x10E3/uL  Comprehensive metabolic panel   Collection Time: 02/12/23  9:39 AM  Result Value Ref Range   Glucose 124 (H) 70 - 99 mg/dL   BUN 23 8 - 27 mg/dL   Creatinine, Ser 8.65 0.76 - 1.27 mg/dL   eGFR 70 >78 IO/NGE/9.52   BUN/Creatinine Ratio 20 10 - 24   Sodium 138 134 - 144 mmol/L   Potassium 4.5 3.5 - 5.2 mmol/L   Chloride 102 96 - 106 mmol/L   CO2 21 20 - 29 mmol/L   Calcium 9.5 8.6 - 10.2 mg/dL   Total Protein 6.8 6.0 - 8.5 g/dL   Albumin 4.2 3.9 - 4.9 g/dL   Globulin, Total 2.6 1.5 - 4.5 g/dL   Bilirubin Total 0.3 0.0 - 1.2 mg/dL   Alkaline Phosphatase 65 44 - 121 IU/L   AST 17 0 - 40 IU/L   ALT 21 0 - 44 IU/L  Hgb A1c w/o eAG   Collection Time: 02/12/23  9:39 AM  Result Value Ref Range   Hgb A1c MFr Bld 6.5 (H) 4.8 - 5.6 %  Lipid Panel w/o Chol/HDL Ratio   Collection Time: 02/12/23  9:39 AM  Result Value Ref Range   Cholesterol, Total 132 100 - 199 mg/dL   Triglycerides 841 (H) 0 - 149 mg/dL   HDL 37 (L) >32 mg/dL   VLDL Cholesterol Cal 28 5 - 40 mg/dL   LDL Chol Calc (NIH) 67 0 - 99 mg/dL      Assessment & Plan:   Problem List Items Addressed This Visit       Cardiovascular and Mediastinum   Hypertension   Not under good control. Will increase his hydralazine to 50mg  and recheck in 3 months. Call with any concerns.       Relevant Medications   hydrALAZINE (APRESOLINE) 50 MG tablet   Other Relevant Orders   Comprehensive metabolic panel   CBC with Differential/Platelet   TSH   Microalbumin, Urine Waived   Senile purpura (HCC)   Reassured patient. Continue to monitor.       Relevant Medications   hydrALAZINE (APRESOLINE) 50 MG tablet   Other Relevant Orders   Comprehensive metabolic panel   CBC with Differential/Platelet     Endocrine   DM type 2 with diabetic peripheral  neuropathy (HCC)   Doing well with A1c of 6.8 up slightly from 6.5. Continue current regimen. Call with any concerns. Refills given. Call with any concerns.       Relevant Orders   Comprehensive metabolic panel   CBC with Differential/Platelet   Microalbumin, Urine Waived   Bayer DCA Hb A1c Waived     Genitourinary   BPH with obstruction/lower urinary  tract symptoms   Under good control on current regimen. Continue current regimen. Continue to monitor. Call with any concerns. Refills given. Labs drawn today.       Relevant Orders   Comprehensive metabolic panel   CBC with Differential/Platelet   PSA     Other   Hyperlipidemia   Under good control on current regimen. Continue current regimen. Continue to monitor. Call with any concerns. Refills given. Labs drawn today.        Relevant Medications   hydrALAZINE (APRESOLINE) 50 MG tablet   Other Relevant Orders   Comprehensive metabolic panel   CBC with Differential/Platelet   Lipid Panel w/o Chol/HDL Ratio   Other Visit Diagnoses       Routine general medical examination at a health care facility    -  Primary   Vaccines up to date. Screening labs checked today. Colonoscopy up to date. Continue diet and exercise. Call with any concerns.        LABORATORY TESTING:  Health maintenance labs ordered today as discussed above.   The natural history of prostate cancer and ongoing controversy regarding screening and potential treatment outcomes of prostate cancer has been discussed with the patient. The meaning of a false positive PSA and a false negative PSA has been discussed. He indicates understanding of the limitations of this screening test and wishes to proceed with screening PSA testing.   IMMUNIZATIONS:   - Tdap: Tetanus vaccination status reviewed: last tetanus booster within 10 years. - Influenza: Up to date - Pneumovax: Up to date - Prevnar: Not applicable - COVID: Refused - HPV: Not applicable - Shingrix  vaccine: Refused  SCREENING: - Colonoscopy: Up to date  Discussed with patient purpose of the colonoscopy is to detect colon cancer at curable precancerous or early stages   PATIENT COUNSELING:    Sexuality: Discussed sexually transmitted diseases, partner selection, use of condoms, avoidance of unintended pregnancy  and contraceptive alternatives.   Advised to avoid cigarette smoking.  I discussed with the patient that most people either abstain from alcohol or drink within safe limits (<=14/week and <=4 drinks/occasion for males, <=7/weeks and <= 3 drinks/occasion for females) and that the risk for alcohol disorders and other health effects rises proportionally with the number of drinks per week and how often a drinker exceeds daily limits.  Discussed cessation/primary prevention of drug use and availability of treatment for abuse.   Diet: Encouraged to adjust caloric intake to maintain  or achieve ideal body weight, to reduce intake of dietary saturated fat and total fat, to limit sodium intake by avoiding high sodium foods and not adding table salt, and to maintain adequate dietary potassium and calcium preferably from fresh fruits, vegetables, and low-fat dairy products.    stressed the importance of regular exercise  Injury prevention: Discussed safety belts, safety helmets, smoke detector, smoking near bedding or upholstery.   Dental health: Discussed importance of regular tooth brushing, flossing, and dental visits.   Follow up plan: NEXT PREVENTATIVE PHYSICAL DUE IN 1 YEAR. Return in about 3 months (around 08/09/2023).

## 2023-05-11 NOTE — Assessment & Plan Note (Signed)
Doing well with A1c of 6.8 up slightly from 6.5. Continue current regimen. Call with any concerns. Refills given. Call with any concerns.

## 2023-05-12 LAB — CBC WITH DIFFERENTIAL/PLATELET
Basophils Absolute: 0.1 10*3/uL (ref 0.0–0.2)
Basos: 1 %
EOS (ABSOLUTE): 0.2 10*3/uL (ref 0.0–0.4)
Eos: 3 %
Hematocrit: 41.2 % (ref 37.5–51.0)
Hemoglobin: 13.7 g/dL (ref 13.0–17.7)
Immature Grans (Abs): 0 10*3/uL (ref 0.0–0.1)
Immature Granulocytes: 0 %
Lymphocytes Absolute: 1.5 10*3/uL (ref 0.7–3.1)
Lymphs: 20 %
MCH: 29.5 pg (ref 26.6–33.0)
MCHC: 33.3 g/dL (ref 31.5–35.7)
MCV: 89 fL (ref 79–97)
Monocytes Absolute: 0.6 10*3/uL (ref 0.1–0.9)
Monocytes: 8 %
Neutrophils Absolute: 5.2 10*3/uL (ref 1.4–7.0)
Neutrophils: 68 %
Platelets: 306 10*3/uL (ref 150–450)
RBC: 4.65 x10E6/uL (ref 4.14–5.80)
RDW: 13.1 % (ref 11.6–15.4)
WBC: 7.6 10*3/uL (ref 3.4–10.8)

## 2023-05-12 LAB — PSA: Prostate Specific Ag, Serum: 4.5 ng/mL — ABNORMAL HIGH (ref 0.0–4.0)

## 2023-05-12 LAB — COMPREHENSIVE METABOLIC PANEL
ALT: 25 [IU]/L (ref 0–44)
AST: 20 [IU]/L (ref 0–40)
Albumin: 4.1 g/dL (ref 3.9–4.9)
Alkaline Phosphatase: 62 [IU]/L (ref 44–121)
BUN/Creatinine Ratio: 16 (ref 10–24)
BUN: 18 mg/dL (ref 8–27)
Bilirubin Total: 0.3 mg/dL (ref 0.0–1.2)
CO2: 21 mmol/L (ref 20–29)
Calcium: 9.8 mg/dL (ref 8.6–10.2)
Chloride: 101 mmol/L (ref 96–106)
Creatinine, Ser: 1.16 mg/dL (ref 0.76–1.27)
Globulin, Total: 3 g/dL (ref 1.5–4.5)
Glucose: 102 mg/dL — ABNORMAL HIGH (ref 70–99)
Potassium: 4.2 mmol/L (ref 3.5–5.2)
Sodium: 137 mmol/L (ref 134–144)
Total Protein: 7.1 g/dL (ref 6.0–8.5)
eGFR: 71 mL/min/{1.73_m2} (ref >59)

## 2023-05-12 LAB — LIPID PANEL W/O CHOL/HDL RATIO
Cholesterol, Total: 117 mg/dL (ref 100–199)
HDL: 37 mg/dL — ABNORMAL LOW (ref >39)
LDL Chol Calc (NIH): 52 mg/dL (ref 0–99)
Triglycerides: 163 mg/dL — ABNORMAL HIGH (ref 0–149)
VLDL Cholesterol Cal: 28 mg/dL (ref 5–40)

## 2023-05-12 LAB — TSH: TSH: 1.7 u[IU]/mL (ref 0.450–4.500)

## 2023-05-18 ENCOUNTER — Other Ambulatory Visit: Payer: Self-pay | Admitting: Family Medicine

## 2023-05-18 DIAGNOSIS — R972 Elevated prostate specific antigen [PSA]: Secondary | ICD-10-CM

## 2023-05-30 ENCOUNTER — Encounter: Payer: Self-pay | Admitting: Family Medicine

## 2023-06-18 ENCOUNTER — Other Ambulatory Visit: Payer: Self-pay

## 2023-06-26 ENCOUNTER — Encounter: Payer: Self-pay | Admitting: Family Medicine

## 2023-06-26 NOTE — Telephone Encounter (Signed)
appt

## 2023-06-29 NOTE — Telephone Encounter (Signed)
Appointment has been made

## 2023-07-02 ENCOUNTER — Ambulatory Visit: Payer: 59 | Admitting: Family Medicine

## 2023-07-02 ENCOUNTER — Ambulatory Visit: Payer: 59 | Attending: Family Medicine

## 2023-07-02 ENCOUNTER — Encounter: Payer: Self-pay | Admitting: Family Medicine

## 2023-07-02 VITALS — BP 128/72 | HR 65 | Temp 98.9°F | Resp 15 | Ht 71.26 in | Wt 222.4 lb

## 2023-07-02 DIAGNOSIS — I10 Essential (primary) hypertension: Secondary | ICD-10-CM

## 2023-07-02 DIAGNOSIS — R002 Palpitations: Secondary | ICD-10-CM

## 2023-07-02 NOTE — Assessment & Plan Note (Signed)
BP running in normal range. Will check zio for palpitations and labs. Concern due to cold medicine. Call with any concerns. Continue to monitor.

## 2023-07-02 NOTE — Progress Notes (Signed)
BP 128/72   Pulse 65   Temp 98.9 F (37.2 C) (Oral)   Resp 15   Ht 5' 11.26" (1.81 m)   Wt 222 lb 6.4 oz (100.9 kg)   SpO2 99%   BMI 30.79 kg/m    Subjective:    Patient ID: Shawn Shaw, male    DOB: Feb 02, 1961, 63 y.o.   MRN: [id number]  HPI: Shawn Shaw is a 63 y.o. male  Chief Complaint  Patient presents with   Hypertension    Medication is making him jittery.    Was feeling pretty jittery on and off since starting the medicine. Feeling better the past couple of days. Felt more anxious and keyed up, "like electricity running through him". Occasional heart racing and skipping beats.   HYPERTENSION  Hypertension status: better  Satisfied with current treatment? unsure Duration of hypertension:  chronic BP monitoring frequency:  few times a month BP range: 130s/70s BP medication side effects:  unsure- has been feeling jittery Medication compliance: excellent compliance Aspirin: no Recurrent headaches: no Visual changes: no Palpitations: yes Dyspnea: no Chest pain: no Lower extremity edema: no Dizzy/lightheaded: no   Relevant past medical, surgical, family and social history reviewed and updated as indicated. Interim medical history since our last visit reviewed. Allergies and medications reviewed and updated.  Review of Systems  Constitutional: Negative.   Respiratory: Negative.    Cardiovascular:  Positive for palpitations. Negative for chest pain and leg swelling.  Gastrointestinal: Negative.   Musculoskeletal: Negative.   Neurological: Negative.   Psychiatric/Behavioral: Negative.      Per HPI unless specifically indicated above     Objective:    BP 128/72   Pulse 65   Temp 98.9 F (37.2 C) (Oral)   Resp 15   Ht 5' 11.26" (1.81 m)   Wt 222 lb 6.4 oz (100.9 kg)   SpO2 99%   BMI 30.79 kg/m   Wt Readings from Last 3 Encounters:  07/02/23 222 lb 6.4 oz (100.9 kg)  05/11/23 222 lb 9.6 oz (101 kg)  03/12/23 217 lb 3.2 oz (98.5 kg)     Physical Exam Vitals and nursing note reviewed.  Constitutional:      General: He is not in acute distress.    Appearance: Normal appearance. He is normal weight. He is not ill-appearing, toxic-appearing or diaphoretic.  HENT:     Head: Normocephalic and atraumatic.     Right Ear: External ear normal.     Left Ear: External ear normal.     Nose: Nose normal.     Mouth/Throat:     Mouth: Mucous membranes are moist.     Pharynx: Oropharynx is clear.  Eyes:     General: No scleral icterus.       Right eye: No discharge.        Left eye: No discharge.     Extraocular Movements: Extraocular movements intact.     Conjunctiva/sclera: Conjunctivae normal.     Pupils: Pupils are equal, round, and reactive to light.  Cardiovascular:     Rate and Rhythm: Normal rate and regular rhythm.     Pulses: Normal pulses.     Heart sounds: Normal heart sounds. No murmur heard.    No friction rub. No gallop.  Pulmonary:     Effort: Pulmonary effort is normal. No respiratory distress.     Breath sounds: Normal breath sounds. No stridor. No wheezing, rhonchi or rales.  Chest:     Chest wall: No tenderness.  Musculoskeletal:        General: Normal range of motion.     Cervical back: Normal range of motion and neck supple.  Skin:    General: Skin is warm and dry.     Capillary Refill: Capillary refill takes less than 2 seconds.     Coloration: Skin is not jaundiced or pale.     Findings: No bruising, erythema, lesion or rash.  Neurological:     General: No focal deficit present.     Mental Status: He is alert and oriented to person, place, and time. Mental status is at baseline.  Psychiatric:        Mood and Affect: Mood normal.        Behavior: Behavior normal.        Thought Content: Thought content normal.        Judgment: Judgment normal.     Results for orders placed or performed in visit on 05/11/23  Microalbumin, Urine Waived   Collection Time: 05/11/23 10:28 AM  Result Value Ref  Range   Microalb, Ur Waived 30 (H) 0 - 19 mg/L   Creatinine, Urine Waived 200 10 - 300 mg/dL   Microalb/Creat Ratio <30 <30 mg/g  Bayer DCA Hb A1c Waived   Collection Time: 05/11/23 10:28 AM  Result Value Ref Range   HB A1C (BAYER DCA - WAIVED) 6.8 (H) 4.8 - 5.6 %  Comprehensive metabolic panel   Collection Time: 05/11/23 10:29 AM  Result Value Ref Range   Glucose 102 (H) 70 - 99 mg/dL   BUN 18 8 - 27 mg/dL   Creatinine, Ser 1.61 0.76 - 1.27 mg/dL   eGFR 71 >09 UE/AVW/0.98   BUN/Creatinine Ratio 16 10 - 24   Sodium 137 134 - 144 mmol/L   Potassium 4.2 3.5 - 5.2 mmol/L   Chloride 101 96 - 106 mmol/L   CO2 21 20 - 29 mmol/L   Calcium 9.8 8.6 - 10.2 mg/dL   Total Protein 7.1 6.0 - 8.5 g/dL   Albumin 4.1 3.9 - 4.9 g/dL   Globulin, Total 3.0 1.5 - 4.5 g/dL   Bilirubin Total 0.3 0.0 - 1.2 mg/dL   Alkaline Phosphatase 62 44 - 121 IU/L   AST 20 0 - 40 IU/L   ALT 25 0 - 44 IU/L  CBC with Differential/Platelet   Collection Time: 05/11/23 10:29 AM  Result Value Ref Range   WBC 7.6 3.4 - 10.8 x10E3/uL   RBC 4.65 4.14 - 5.80 x10E6/uL   Hemoglobin 13.7 13.0 - 17.7 g/dL   Hematocrit 11.9 14.7 - 51.0 %   MCV 89 79 - 97 fL   MCH 29.5 26.6 - 33.0 pg   MCHC 33.3 31.5 - 35.7 g/dL   RDW 82.9 56.2 - 13.0 %   Platelets 306 150 - 450 x10E3/uL   Neutrophils 68 Not Estab. %   Lymphs 20 Not Estab. %   Monocytes 8 Not Estab. %   Eos 3 Not Estab. %   Basos 1 Not Estab. %   Neutrophils Absolute 5.2 1.4 - 7.0 x10E3/uL   Lymphocytes Absolute 1.5 0.7 - 3.1 x10E3/uL   Monocytes Absolute 0.6 0.1 - 0.9 x10E3/uL   EOS (ABSOLUTE) 0.2 0.0 - 0.4 x10E3/uL   Basophils Absolute 0.1 0.0 - 0.2 x10E3/uL   Immature Granulocytes 0 Not Estab. %   Immature Grans (Abs) 0.0 0.0 - 0.1 x10E3/uL  Lipid Panel w/o Chol/HDL Ratio   Collection Time: 05/11/23 10:29 AM  Result Value Ref  Range   Cholesterol, Total 117 100 - 199 mg/dL   Triglycerides 098 (H) 0 - 149 mg/dL   HDL 37 (L) >11 mg/dL   VLDL Cholesterol Cal 28  5 - 40 mg/dL   LDL Chol Calc (NIH) 52 0 - 99 mg/dL  PSA   Collection Time: 05/11/23 10:29 AM  Result Value Ref Range   Prostate Specific Ag, Serum 4.5 (H) 0.0 - 4.0 ng/mL  TSH   Collection Time: 05/11/23 10:29 AM  Result Value Ref Range   TSH 1.700 0.450 - 4.500 uIU/mL      Assessment & Plan:   Problem List Items Addressed This Visit       Cardiovascular and Mediastinum   Hypertension   BP running in normal range. Will check zio for palpitations and labs. Concern due to cold medicine. Call with any concerns. Continue to monitor.      Other Visit Diagnoses       Palpitations    -  Primary   Will check zio for palpitations and labs. Concern due to cold medicine. Call with any concerns. Continue to monitor.   Relevant Orders   LONG TERM MONITOR (3-14 DAYS)   CBC with Differential/Platelet   Basic metabolic panel   TSH   Magnesium   Phosphorus        Follow up plan: Return for As scheduled.

## 2023-07-03 ENCOUNTER — Encounter: Payer: Self-pay | Admitting: Family Medicine

## 2023-07-03 LAB — MAGNESIUM: Magnesium: 2 mg/dL (ref 1.6–2.3)

## 2023-07-03 LAB — CBC WITH DIFFERENTIAL/PLATELET
Basophils Absolute: 0.1 10*3/uL (ref 0.0–0.2)
Basos: 1 %
EOS (ABSOLUTE): 0.3 10*3/uL (ref 0.0–0.4)
Eos: 3 %
Hematocrit: 45 % (ref 37.5–51.0)
Hemoglobin: 14.8 g/dL (ref 13.0–17.7)
Immature Grans (Abs): 0 10*3/uL (ref 0.0–0.1)
Immature Granulocytes: 0 %
Lymphocytes Absolute: 1.4 10*3/uL (ref 0.7–3.1)
Lymphs: 17 %
MCH: 29.7 pg (ref 26.6–33.0)
MCHC: 32.9 g/dL (ref 31.5–35.7)
MCV: 90 fL (ref 79–97)
Monocytes Absolute: 0.5 10*3/uL (ref 0.1–0.9)
Monocytes: 7 %
Neutrophils Absolute: 5.9 10*3/uL (ref 1.4–7.0)
Neutrophils: 72 %
Platelets: 366 10*3/uL (ref 150–450)
RBC: 4.98 x10E6/uL (ref 4.14–5.80)
RDW: 13.3 % (ref 11.6–15.4)
WBC: 8.2 10*3/uL (ref 3.4–10.8)

## 2023-07-03 LAB — BASIC METABOLIC PANEL
BUN/Creatinine Ratio: 16 (ref 10–24)
BUN: 18 mg/dL (ref 8–27)
CO2: 21 mmol/L (ref 20–29)
Calcium: 9.8 mg/dL (ref 8.6–10.2)
Chloride: 100 mmol/L (ref 96–106)
Creatinine, Ser: 1.14 mg/dL (ref 0.76–1.27)
Glucose: 114 mg/dL — ABNORMAL HIGH (ref 70–99)
Potassium: 4.2 mmol/L (ref 3.5–5.2)
Sodium: 137 mmol/L (ref 134–144)
eGFR: 72 mL/min/{1.73_m2} (ref >59)

## 2023-07-03 LAB — TSH: TSH: 1.62 u[IU]/mL (ref 0.450–4.500)

## 2023-07-03 LAB — PHOSPHORUS: Phosphorus: 2.7 mg/dL — ABNORMAL LOW (ref 2.8–4.1)

## 2023-07-06 ENCOUNTER — Ambulatory Visit: Payer: Self-pay | Admitting: Family Medicine

## 2023-08-08 ENCOUNTER — Other Ambulatory Visit: Payer: Self-pay | Admitting: Family Medicine

## 2023-08-09 NOTE — Telephone Encounter (Signed)
 Requested medications are due for refill today.  yes  Requested medications are on the active medications list.  yes  Last refill. 04/22/2021  Future visit scheduled.   yes  Notes to clinic.  Refill not delegated.    Requested Prescriptions  Pending Prescriptions Disp Refills   triamcinolone cream (KENALOG) 0.1 % [Pharmacy Med Name: TRIAMCINOLONE 0.1% CREAM] 30 g 0    Sig: Apply 1 application topically 2 (two) times daily.     Not Delegated - Dermatology:  Corticosteroids Failed - 08/09/2023  2:51 PM      Failed - This refill cannot be delegated      Passed - Valid encounter within last 12 months    Recent Outpatient Visits           1 month ago Palpitations   [address] [facility] [place], [place], DO       Future Appointments             In 1 week Laural Benes, Oralia Rud, DO [place] [facility]            Signed Prescriptions Disp Refills   hydrALAZINE (APRESOLINE) 50 MG tablet 180 tablet 1    Sig: Take 1 tablet (50 mg total) by mouth 2 (two) times daily.     Cardiovascular:  Vasodilators Failed - 08/09/2023  2:51 PM      Failed - ANA Screen, Ifa, Serum in normal range and within 360 days    No results found for: "ANA", "ANATITER", "LABANTI"       Passed - HCT in normal range and within 360 days    Hematocrit  Date Value Ref Range Status  07/02/2023 45.0 37.5 - 51.0 % Final         Passed - HGB in normal range and within 360 days    Hemoglobin  Date Value Ref Range Status  07/02/2023 14.8 13.0 - 17.7 g/dL Final         Passed - RBC in normal range and within 360 days    RBC  Date Value Ref Range Status  07/02/2023 4.98 4.14 - 5.80 x10E6/uL Final         Passed - WBC in normal range and within 360 days    WBC  Date Value Ref Range Status  07/02/2023 8.2 3.4 - 10.8 x10E3/uL Final         Passed - PLT in normal range and within 360 days    Platelets  Date Value Ref Range Status  07/02/2023 366 150 - 450 x10E3/uL  Final         Passed - Last BP in normal range    BP Readings from Last 1 Encounters:  07/02/23 128/72         Passed - Valid encounter within last 12 months    Recent Outpatient Visits           1 month ago Palpitations   [place] [facility] [place], Oralia Rud, DO       Future Appointments             In 1 week Dorcas Carrow, DO [place] [facility]

## 2023-08-09 NOTE — Telephone Encounter (Signed)
 Requested Prescriptions  Pending Prescriptions Disp Refills   hydrALAZINE (APRESOLINE) 50 MG tablet [Pharmacy Med Name: HYDRALAZINE 50 MG TABLET] 180 tablet 1    Sig: Take 1 tablet (50 mg total) by mouth 2 (two) times daily.     Cardiovascular:  Vasodilators Failed - 08/09/2023  2:51 PM      Failed - ANA Screen, Ifa, Serum in normal range and within 360 days    No results found for: "ANA", "ANATITER", "LABANTI"       Passed - HCT in normal range and within 360 days    Hematocrit  Date Value Ref Range Status  07/02/2023 45.0 37.5 - 51.0 % Final         Passed - HGB in normal range and within 360 days    Hemoglobin  Date Value Ref Range Status  07/02/2023 14.8 13.0 - 17.7 g/dL Final         Passed - RBC in normal range and within 360 days    RBC  Date Value Ref Range Status  07/02/2023 4.98 4.14 - 5.80 x10E6/uL Final         Passed - WBC in normal range and within 360 days    WBC  Date Value Ref Range Status  07/02/2023 8.2 3.4 - 10.8 x10E3/uL Final         Passed - PLT in normal range and within 360 days    Platelets  Date Value Ref Range Status  07/02/2023 366 150 - 450 x10E3/uL Final         Passed - Last BP in normal range    BP Readings from Last 1 Encounters:  07/02/23 128/72         Passed - Valid encounter within last 12 months    Recent Outpatient Visits           1 month ago Palpitations   [place] [facility] [place], [place], DO       Future Appointments             In 1 week Laural Benes, Oralia Rud, DO [facility] [facility]             triamcinolone cream (KENALOG) 0.1 % [Pharmacy Med Name: TRIAMCINOLONE 0.1% CREAM] 30 g 0    Sig: Apply 1 application topically 2 (two) times daily.     Not Delegated - Dermatology:  Corticosteroids Failed - 08/09/2023  2:51 PM      Failed - This refill cannot be delegated      Passed - Valid encounter within last 12 months    Recent Outpatient Visits           1 month ago  Palpitations   [place] [facility] [place], Oralia Rud, DO       Future Appointments             In 1 week Laural Benes, Oralia Rud, DO [place] [facility], [facility]

## 2023-08-17 ENCOUNTER — Ambulatory Visit: Payer: Self-pay | Admitting: Family Medicine

## 2023-08-17 ENCOUNTER — Encounter: Payer: Self-pay | Admitting: Family Medicine

## 2023-08-17 VITALS — BP 156/84 | HR 60 | Temp 98.2°F | Wt 217.8 lb

## 2023-08-17 DIAGNOSIS — I10 Essential (primary) hypertension: Secondary | ICD-10-CM | POA: Diagnosis not present

## 2023-08-17 DIAGNOSIS — R002 Palpitations: Secondary | ICD-10-CM | POA: Diagnosis not present

## 2023-08-17 DIAGNOSIS — Z7984 Long term (current) use of oral hypoglycemic drugs: Secondary | ICD-10-CM

## 2023-08-17 DIAGNOSIS — E1142 Type 2 diabetes mellitus with diabetic polyneuropathy: Secondary | ICD-10-CM

## 2023-08-17 LAB — BAYER DCA HB A1C WAIVED: HB A1C (BAYER DCA - WAIVED): 6.7 % — ABNORMAL HIGH (ref 4.8–5.6)

## 2023-08-17 MED ORDER — SITAGLIPTIN PHOSPHATE 100 MG PO TABS
100.0000 mg | ORAL_TABLET | Freq: Every day | ORAL | 1 refills | Status: DC
Start: 2023-08-17 — End: 2023-12-12

## 2023-08-17 MED ORDER — PREGABALIN 75 MG PO CAPS: ORAL_CAPSULE | ORAL | 1 refills | Status: DC

## 2023-08-17 MED ORDER — LOSARTAN POTASSIUM-HCTZ 100-25 MG PO TABS: 1.0000 | ORAL_TABLET | Freq: Every day | ORAL | 1 refills | Status: DC

## 2023-08-17 MED ORDER — METFORMIN HCL ER 500 MG PO TB24: 1000.0000 mg | ORAL_TABLET | Freq: Two times a day (BID) | ORAL | 1 refills | Status: DC

## 2023-08-17 MED ORDER — ROSUVASTATIN CALCIUM 5 MG PO TABS: 5.0000 mg | ORAL_TABLET | Freq: Every day | ORAL | 1 refills | Status: DC

## 2023-08-17 NOTE — Progress Notes (Signed)
 BP (!) 156/84 (BP Location: Left Arm, Patient Position: Sitting, Cuff Size: Large) Comment: Pt has been without his bp meds for 2 days  Pulse 60   Temp 98.2 F (36.8 C) (Oral)   Wt 217 lb 12.8 oz (98.8 kg)   SpO2 98%   BMI 30.16 kg/m    Subjective:    Patient ID: Shawn Shaw, male    DOB: 06/21/1960, 63 y.o.   MRN: [id number]  HPI: Shawn Shaw is a 63 y.o. male  Chief Complaint  Patient presents with   Diabetes   Hypertension   DIABETES Hypoglycemic episodes:no Polydipsia/polyuria: no Visual disturbance: no Chest pain: no Paresthesias: no Glucose Monitoring: no  Accucheck frequency:  rarely Taking Insulin?: no Blood Pressure Monitoring: not checking Retinal Examination: Up to Date Foot Exam: Up to Date Diabetic Education: Not Completed Pneumovax: Up to Date Influenza: Up to Date Aspirin: no  Relevant past medical, surgical, family and social history reviewed and updated as indicated. Interim medical history since our last visit reviewed. Allergies and medications reviewed and updated.  Review of Systems  Constitutional: Negative.   Respiratory: Negative.    Cardiovascular: Negative.   Musculoskeletal: Negative.   Neurological: Negative.   Psychiatric/Behavioral: Negative.      Per HPI unless specifically indicated above     Objective:    BP (!) 156/84 (BP Location: Left Arm, Patient Position: Sitting, Cuff Size: Large) Comment: Pt has been without his bp meds for 2 days  Pulse 60   Temp 98.2 F (36.8 C) (Oral)   Wt 217 lb 12.8 oz (98.8 kg)   SpO2 98%   BMI 30.16 kg/m   Wt Readings from Last 3 Encounters:  08/17/23 217 lb 12.8 oz (98.8 kg)  07/02/23 222 lb 6.4 oz (100.9 kg)  05/11/23 222 lb 9.6 oz (101 kg)    Physical Exam Vitals and nursing note reviewed.  Constitutional:      General: He is not in acute distress.    Appearance: Normal appearance. He is not ill-appearing, toxic-appearing or diaphoretic.  HENT:     Head: Normocephalic and  atraumatic.     Right Ear: External ear normal.     Left Ear: External ear normal.     Nose: Nose normal.     Mouth/Throat:     Mouth: Mucous membranes are moist.     Pharynx: Oropharynx is clear.  Eyes:     General: No scleral icterus.       Right eye: No discharge.        Left eye: No discharge.     Extraocular Movements: Extraocular movements intact.     Conjunctiva/sclera: Conjunctivae normal.     Pupils: Pupils are equal, round, and reactive to light.  Cardiovascular:     Rate and Rhythm: Normal rate and regular rhythm.     Pulses: Normal pulses.     Heart sounds: Normal heart sounds. No murmur heard.    No friction rub. No gallop.  Pulmonary:     Effort: Pulmonary effort is normal. No respiratory distress.     Breath sounds: Normal breath sounds. No stridor. No wheezing, rhonchi or rales.  Chest:     Chest wall: No tenderness.  Musculoskeletal:        General: Normal range of motion.     Cervical back: Normal range of motion and neck supple.  Skin:    General: Skin is warm and dry.     Capillary Refill: Capillary refill takes less than 2  seconds.     Coloration: Skin is not jaundiced or pale.     Findings: No bruising, erythema, lesion or rash.  Neurological:     General: No focal deficit present.     Mental Status: He is alert and oriented to person, place, and time. Mental status is at baseline.  Psychiatric:        Mood and Affect: Mood normal.        Behavior: Behavior normal.        Thought Content: Thought content normal.        Judgment: Judgment normal.     Results for orders placed or performed in visit on 07/02/23  CBC with Differential/Platelet   Collection Time: 07/02/23 11:07 AM  Result Value Ref Range   WBC 8.2 3.4 - 10.8 x10E3/uL   RBC 4.98 4.14 - 5.80 x10E6/uL   Hemoglobin 14.8 13.0 - 17.7 g/dL   Hematocrit 28.4 13.2 - 51.0 %   MCV 90 79 - 97 fL   MCH 29.7 26.6 - 33.0 pg   MCHC 32.9 31.5 - 35.7 g/dL   RDW 44.0 10.2 - 72.5 %   Platelets 366  150 - 450 x10E3/uL   Neutrophils 72 Not Estab. %   Lymphs 17 Not Estab. %   Monocytes 7 Not Estab. %   Eos 3 Not Estab. %   Basos 1 Not Estab. %   Neutrophils Absolute 5.9 1.4 - 7.0 x10E3/uL   Lymphocytes Absolute 1.4 0.7 - 3.1 x10E3/uL   Monocytes Absolute 0.5 0.1 - 0.9 x10E3/uL   EOS (ABSOLUTE) 0.3 0.0 - 0.4 x10E3/uL   Basophils Absolute 0.1 0.0 - 0.2 x10E3/uL   Immature Granulocytes 0 Not Estab. %   Immature Grans (Abs) 0.0 0.0 - 0.1 x10E3/uL  Basic metabolic panel   Collection Time: 07/02/23 11:07 AM  Result Value Ref Range   Glucose 114 (H) 70 - 99 mg/dL   BUN 18 8 - 27 mg/dL   Creatinine, Ser 3.66 0.76 - 1.27 mg/dL   eGFR 72 >44 IH/KVQ/2.59   BUN/Creatinine Ratio 16 10 - 24   Sodium 137 134 - 144 mmol/L   Potassium 4.2 3.5 - 5.2 mmol/L   Chloride 100 96 - 106 mmol/L   CO2 21 20 - 29 mmol/L   Calcium 9.8 8.6 - 10.2 mg/dL  TSH   Collection Time: 07/02/23 11:07 AM  Result Value Ref Range   TSH 1.620 0.450 - 4.500 uIU/mL  Magnesium   Collection Time: 07/02/23 11:07 AM  Result Value Ref Range   Magnesium 2.0 1.6 - 2.3 mg/dL  Phosphorus   Collection Time: 07/02/23 11:07 AM  Result Value Ref Range   Phosphorus 2.7 (L) 2.8 - 4.1 mg/dL      Assessment & Plan:   Problem List Items Addressed This Visit       Cardiovascular and Mediastinum   Hypertension   Has been off is hydralazine for 2 days- will pick up from the pharmacy. Call with any concerns.       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 Continue current regimen. Continue to monitor. Call with any concerns.       Relevant Medications   losartan-hydrochlorothiazide (HYZAAR) 100-25 MG tablet   metFORMIN (GLUCOPHAGE-XR) 500 MG 24 hr tablet   pregabalin (LYRICA) 75 MG  capsule   rosuvastatin (CRESTOR) 5 MG tablet   sitaGLIPtin  (JANUVIA) 100 MG tablet   Other Relevant Orders   Bayer DCA Hb A1c Waived   Other Visit Diagnoses       Palpitations       Didn't do his zio. He has not used his inhaler and has not had any problems since then.        Follow up plan: Return in about 3 months (around 11/16/2023).

## 2023-08-17 NOTE — Assessment & Plan Note (Signed)
Doing well with A1c of 6.7. Continue current regimen. Continue to monitor. Call with any concerns.

## 2023-08-17 NOTE — Assessment & Plan Note (Signed)
 Has been off is hydralazine for 2 days- will pick up from the pharmacy. Call with any concerns.

## 2023-09-26 ENCOUNTER — Ambulatory Visit (INDEPENDENT_AMBULATORY_CARE_PROVIDER_SITE_OTHER)

## 2023-09-26 ENCOUNTER — Encounter: Payer: Self-pay | Admitting: Emergency Medicine

## 2023-09-26 ENCOUNTER — Ambulatory Visit: Payer: Self-pay

## 2023-09-26 ENCOUNTER — Ambulatory Visit
Admission: EM | Admit: 2023-09-26 | Discharge: 2023-09-26 | Disposition: A | Discharge status: Home / Self Care | Attending: Physician Assistant | Admitting: Physician Assistant

## 2023-09-26 DIAGNOSIS — R0602 Shortness of breath: Secondary | ICD-10-CM | POA: Diagnosis not present

## 2023-09-26 DIAGNOSIS — R5383 Other fatigue: Secondary | ICD-10-CM | POA: Diagnosis not present

## 2023-09-26 DIAGNOSIS — B349 Viral infection, unspecified: Secondary | ICD-10-CM

## 2023-09-26 DIAGNOSIS — R051 Acute cough: Secondary | ICD-10-CM

## 2023-09-26 DIAGNOSIS — I10 Essential (primary) hypertension: Secondary | ICD-10-CM

## 2023-09-26 MED ORDER — PROMETHAZINE-DM 6.25-15 MG/5ML PO SYRP: 5.0000 mL | ORAL_SOLUTION | Freq: Four times a day (QID) | ORAL | 0 refills | Status: DC | PRN

## 2023-09-26 NOTE — ED Provider Notes (Signed)
 [facility]    CSN: [id number] Arrival date & time: 09/26/23  1525      History   Chief Complaint Chief Complaint  Patient presents with   Cough   Shortness of Breath   Nasal Congestion    HPI Shawn Shaw is a 63 y.o. male with history of diabetes, hypertension, hyperlipidemia, anxiety and depression.   Patient presents today for productive cough, congestion, fatigue and shortness of breath for the past 5 days.  Patient thinks his symptoms have gotten a bit better but he still was not feeling well.  He denies any associated fevers, sore throat, sinus pain, ear pain, chest pain, wheezing, abdominal pain, vomiting or diarrhea.  Patient's wife was ill before he was.  He has been taking Mucinex over-the-counter.  He has no history of asthma or tobacco related lung disease.  History of tobacco abuse years ago but currently non-smoker.  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 Authorizing Provider  promethazine-dextromethorphan (PROMETHAZINE-DM) 6.25-15 MG/5ML syrup Take 5 mLs by mouth 4 (four) times daily as needed. 09/26/23  Yes Nancy Axon B, PA-C  budesonide -formoterol  (SYMBICORT ) 160-4.5 MCG/ACT inhaler Inhale 2 puffs into the lungs 2 (two) times daily. 05/11/23   Johnson, Megan P, DO  clindamycin -benzoyl peroxide (BENZACLIN) gel Apply topically 2 (two) times daily. 05/11/23    Johnson, Megan P, DO  finasteride (PROSCAR) 5 MG tablet 5 mg daily. 07/27/15   [provider]  hydrALAZINE  (APRESOLINE ) 50 MG tablet Take 1 tablet (50 mg total) by mouth 2 (two) times daily. 08/09/23   Johnson, Megan P, DO  losartan -hydrochlorothiazide (HYZAAR) 100-25 MG tablet Take 1 tablet by mouth daily. 08/17/23   Johnson, Megan P, DO  metFORMIN  (GLUCOPHAGE -XR) 500 MG 24 hr tablet Take 2 tablets (1,000 mg total) by mouth in the morning and at bedtime. 08/17/23   Terre Ferri P, DO  pregabalin  (LYRICA ) 75 MG capsule Take 1 in the AM and 2 in the PM 08/17/23   Johnson, Megan P, DO  rosuvastatin  (CRESTOR ) 5 MG tablet Take 1 tablet (5 mg total) by mouth daily. 08/17/23   Johnson, Megan P, DO  sildenafil (REVATIO) 20 MG tablet Take 20 mg by mouth daily as needed. 08/14/19   [provider]  sitaGLIPtin  (JANUVIA ) 100 MG tablet Take 1 tablet (100 mg total) by mouth daily. 08/17/23   Johnson, Megan P, DO  triamcinolone  cream (KENALOG ) 0.1 % Apply 1 application topically 2 (two) times daily. 08/10/23   Solomon Dupre, DO    Family History Family History  Family history unknown: Yes    Social History Social History   Tobacco Use   Smoking status: Former    Current packs/day: 0.00    Types: Cigarettes    Quit date: 12/22/2008    Years since quitting: 14.7  Smokeless tobacco: Never  Vaping Use   Vaping status: Never Used  Substance Use Topics   Alcohol use: Not Currently    Alcohol/week: 0.0 standard drinks of alcohol   Drug use: No     Allergies   Trulicity  [dulaglutide ] and Lisinopril   Review of Systems Review of Systems  Constitutional:  Positive for fatigue. Negative for fever.  HENT:  Positive for congestion and rhinorrhea. Negative for sinus pressure, sinus pain and sore throat.   Respiratory:  Positive for cough and shortness of breath. Negative for wheezing.   Cardiovascular:  Negative for chest pain.  Gastrointestinal:  Negative for abdominal pain, diarrhea,  nausea and vomiting.  Musculoskeletal:  Negative for myalgias.  Neurological:  Negative for weakness, light-headedness and headaches.  Hematological:  Negative for adenopathy.     Physical Exam Triage Vital Signs ED Triage Vitals  Encounter Vitals Group     BP      Systolic BP Percentile      Diastolic BP Percentile      Pulse      Resp      Temp      Temp src      SpO2      Weight      Height      Head Circumference      Peak Flow      Pain Score      Pain Loc      Pain Education      Exclude from Growth Chart    No data found.  Updated Vital Signs BP (!) 160/81 (BP Location: Left Arm)   Pulse (!) 50   Temp 97.9 F (36.6 C) (Oral)   Resp 18   SpO2 97%      Physical Exam Vitals and nursing note reviewed.  Constitutional:      General: He is not in acute distress.    Appearance: Normal appearance. He is well-developed. He is not ill-appearing.  HENT:     Head: Normocephalic and atraumatic.     Nose: Congestion present.     Mouth/Throat:     Mouth: Mucous membranes are moist.     Pharynx: Oropharynx is clear.  Eyes:     General: No scleral icterus.    Conjunctiva/sclera: Conjunctivae normal.  Cardiovascular:     Rate and Rhythm: Regular rhythm. Bradycardia present.  Pulmonary:     Effort: Pulmonary effort is normal. No respiratory distress.     Breath sounds: Normal breath sounds.  Musculoskeletal:     Cervical back: Neck supple.  Skin:    General: Skin is warm and dry.     Capillary Refill: Capillary refill takes less than 2 seconds.  Neurological:     General: No focal deficit present.     Mental Status: He is alert. Mental status is at baseline.     Motor: No weakness.     Gait: Gait normal.  Psychiatric:        Mood and Affect: Mood normal.        Behavior: Behavior normal.      UC Treatments / Results  Labs (all labs ordered are listed, but only abnormal results are displayed) Labs Reviewed - No data to display  EKG   Radiology DG  Chest 2 View Result Date: 09/26/2023 CLINICAL DATA:  Cough and shortness of breath for 5 days. EXAM: CHEST - 2 VIEW COMPARISON:  None Available. FINDINGS: The heart size and mediastinal contours are within normal limits. Both lungs  are clear. The visualized skeletal structures are unremarkable. IMPRESSION: No active cardiopulmonary disease. Electronically Signed   By: Rosalene Colon M.D.   On: 09/26/2023 16:05    Procedures Procedures (including critical care time)  Medications Ordered in UC Medications - No data to display  Initial Impression / Assessment and Plan / UC Course  I have reviewed the triage vital signs and the nursing notes.  Pertinent labs & imaging results that were available during my care of the patient were reviewed by me and considered in my medical decision making (see chart for details).   63 y/o male with history of T2DM, HTN, HLD, depression and anxiety presents for 5-day history of productive cough, fatigue, nasal congestion and shortness of breath.  Symptoms have improved from onset.  No associated fever, flank pain, sore throat or chest pain.  Wife was sick before he was.  BP elevated 160/81.  Pulse 50 bpm.  Overall well-appearing.  No acute distress.  On exam has mildly congestion.  Throat is clear.  Chest clear.  Patient advised to continue taking hydralazine , losartan /HCTZ for hypertension.  Chart review shows prescription for Symbicort  but I am unsure why he is taking this medication as I do not see documented COPD or asthma.  Patient has albuterol inhaler he has been using as needed.  Chest x-ray performed is negative for pneumonia.  Reviewed this with patient.  Viral illness.  Possible bronchitis.  Appears to have improved from onset.  Supportive care encouraged.  Sent Promethazine DM to pharmacy.  Continue use of albuterol inhaler as needed.  Advised to return if he develops fever, worsening cough, chest pain or increased shortness of breath from  baseline.   Final Clinical Impressions(s) / UC Diagnoses   Final diagnoses:  Shortness of breath  Viral illness  Acute cough  Other fatigue  Essential hypertension     Discharge Instructions      URI/COLD SYMPTOMS: Your exam today is consistent with a viral illness. Antibiotics are not indicated at this time. Use medications as directed, including cough syrup, nasal saline, and decongestants. Your symptoms should improve over the next few days and resolve within 7-10 days. Increase rest and fluids. F/u if symptoms worsen or predominate such as sore throat, ear pain, productive cough, shortness of breath, or if you develop high fevers or worsening fatigue over the next several days.     ED Prescriptions     Medication Sig Dispense Auth. Provider   promethazine-dextromethorphan (PROMETHAZINE-DM) 6.25-15 MG/5ML syrup Take 5 mLs by mouth 4 (four) times daily as needed. 118 mL Floydene Hy, PA-C      PDMP not reviewed this encounter.   Floydene Hy, PA-C 09/26/23 1644

## 2023-09-26 NOTE — Discharge Instructions (Addendum)

## 2023-09-26 NOTE — Telephone Encounter (Signed)
 Copied from CRM [id number]. Topic: Appointments - Appointment Scheduling >> Sep 26, 2023 12:45 PM Lorenz Romano B wrote: Patient/patient representative is calling to schedule an appointment. Refer to attachments for appointment information.  Patient is having trouble breathing and has a cough   Chief Complaint: Cough Symptoms: Cough, shortness of breath, wheezing, nasal contention  Frequency: Constant  Pertinent Negatives: Patient denies current fever or vomiting  Disposition: [] ED /[x] Urgent Care (no appt availability in office) / [] Appointment(In office/virtual)/ []  [facility]/ [] Home Care/ [] Refused Recommended Disposition /[] [facility]/ []  Follow-up with PCP Additional Notes: Patient reports that he has had a cough that is productive of clear sputum for the last 5 days. He states that with his cough he is also experiencing some mild shortness of breath, wheezing, and nasal congestion. Patient advised there are no appointments available in the office and that he should go to urgent care for evaluation and treatment of his symptoms. Patient verbalized understanding and agreement with this plan. Patient did verbalize frustration with not being able to come in and see Dr. Lincoln Renshaw.   Reason for Disposition  [1] MILD difficulty breathing (e.g., minimal/no SOB at rest, SOB with walking, pulse <100) AND [2] still present when not coughing  Answer Assessment - Initial Assessment Questions 1. ONSET: "When did the cough begin?"      5 days ago 2. SEVERITY: "How bad is the cough today?"      Mild to moderate  3. SPUTUM: "Describe the color of your sputum" (none, dry cough; clear, white, yellow, green)     Clear 4. HEMOPTYSIS: "Are you coughing up any blood?" If so ask: "How much?" (flecks, streaks, tablespoons, etc.)     No 5. DIFFICULTY BREATHING: "Are you having difficulty breathing?" If Yes, ask: "How bad is it?" (e.g., mild, moderate, severe)    - MILD: No SOB at rest, mild SOB  with walking, speaks normally in sentences, can lie down, no retractions, pulse < 100.    - MODERATE: SOB at rest, SOB with minimal exertion and prefers to sit, cannot lie down flat, speaks in phrases, mild retractions, audible wheezing, pulse 100-120.    - SEVERE: Very SOB at rest, speaks in single words, struggling to breathe, sitting hunched forward, retractions, pulse > 120      Mild 6. FEVER: "Do you have a fever?" If Yes, ask: "What is your temperature, how was it measured, and when did it start?"     No 7. CARDIAC HISTORY: "Do you have any history of heart disease?" (e.g., heart attack, congestive heart failure)      No 8. LUNG HISTORY: "Do you have any history of lung disease?"  (e.g., pulmonary embolus, asthma, emphysema)     No 9. PE RISK FACTORS: "Do you have a history of blood clots?" (or: recent major surgery, recent prolonged travel, bedridden)     No 10. OTHER SYMPTOMS: "Do you have any other symptoms?" (e.g., runny nose, wheezing, chest pain)       Wheezing, nasal congestion  Protocols used: Cough - Acute Productive-A-AH

## 2023-09-26 NOTE — ED Triage Notes (Signed)
 Pt presents with a productive cough, congestion and SOB x 5 days. Pt has taken Mucinex and Sudafed for his symptoms.

## 2023-09-28 NOTE — Telephone Encounter (Signed)
 Chest x-ray performed is negative for pneumonia.  Reviewed this with patient.   Viral illness.  Possible bronchitis.  Appears to have improved from onset.  Supportive care encouraged.  Sent Promethazine DM to pharmacy.  Continue use of albuterol inhaler as needed.  Advised to return if he develops fever, worsening cough, chest pain or increased shortness of breath from baseline.

## 2023-11-09 ENCOUNTER — Encounter: Payer: Self-pay | Admitting: Family Medicine

## 2023-11-23 ENCOUNTER — Ambulatory Visit: Admitting: Family Medicine

## 2023-12-03 ENCOUNTER — Other Ambulatory Visit: Payer: Self-pay | Admitting: Family Medicine

## 2023-12-05 NOTE — Telephone Encounter (Signed)
 Rx - 08/17/23 #90 1RF for both- too soon Requested Prescriptions  Pending Prescriptions Disp Refills   losartan -hydrochlorothiazide (HYZAAR) 100-25 MG tablet [Pharmacy Med Name: LOSARTAN -HCTZ 100-25 MG TAB] 90 tablet 0    Sig: Take 1 tablet by mouth daily.     Cardiovascular: ARB + Diuretic Combos Failed - 12/05/2023 12:01 PM      Failed - Last BP in normal range    BP Readings from Last 1 Encounters:  09/26/23 (!) 160/81         Passed - K in normal range and within 180 days    Potassium  Date Value Ref Range Status  07/02/2023 4.2 3.5 - 5.2 mmol/L Final         Passed - Na in normal range and within 180 days    Sodium  Date Value Ref Range Status  07/02/2023 137 134 - 144 mmol/L Final         Passed - Cr in normal range and within 180 days    Creatinine, Ser  Date Value Ref Range Status  07/02/2023 1.14 0.76 - 1.27 mg/dL Final         Passed - eGFR is 10 or above and within 180 days    GFR calc Af Amer  Date Value Ref Range Status  04/16/2020 83 >59 mL/min/1.73 Final    Comment:    **In accordance with recommendations from the NKF-ASN Task force,**   [facility] is in the process of updating its eGFR calculation to the   2021 CKD-EPI creatinine equation that estimates kidney function   without a race variable.    GFR calc non Af Amer  Date Value Ref Range Status  04/16/2020 72 >59 mL/min/1.73 Final   eGFR  Date Value Ref Range Status  07/02/2023 72 >59 mL/min/1.73 Final         Passed - Patient is not pregnant      Passed - Valid encounter within last 6 months    Recent Outpatient Visits           3 months ago DM type 2 with diabetic peripheral neuropathy (HCC)   [place] [facility] [place], Megan P, DO   5 months ago Palpitations    [facility] [place], Megan P, DO               rosuvastatin  (CRESTOR ) 5 MG tablet [Pharmacy Med Name: ROSUVASTATIN  CALCIUM  5 MG TAB] 90 tablet 0    Sig: Take 1 tablet (5 mg total) by  mouth daily.     Cardiovascular:  Antilipid - Statins 2 Failed - 12/05/2023 12:01 PM      Failed - Lipid Panel in normal range within the last 12 months    Cholesterol, Total  Date Value Ref Range Status  05/11/2023 117 100 - 199 mg/dL Final   Cholesterol Piccolo, Waived  Date Value Ref Range Status  02/28/2018 124 <200 mg/dL Final    Comment:                            Desirable                <200                         Borderline High      200- 239  High                     >239    LDL Chol Calc (NIH)  Date Value Ref Range Status  05/11/2023 52 0 - 99 mg/dL Final   HDL  Date Value Ref Range Status  05/11/2023 37 (L) >39 mg/dL Final   Triglycerides  Date Value Ref Range Status  05/11/2023 163 (H) 0 - 149 mg/dL Final   Triglycerides Piccolo,Waived  Date Value Ref Range Status  02/28/2018 278 (H) <150 mg/dL Final    Comment:                            Normal                   <150                         Borderline High     150 - 199                         High                200 - 499                         Very High                >499          Passed - Cr in normal range and within 360 days    Creatinine, Ser  Date Value Ref Range Status  07/02/2023 1.14 0.76 - 1.27 mg/dL Final         Passed - Patient is not pregnant      Passed - Valid encounter within last 12 months    Recent Outpatient Visits           3 months ago DM type 2 with diabetic peripheral neuropathy [facility])   [place] [facility] [place], Megan P, DO   5 months ago Palpitations   [place] [facility] [place], [place], DO

## 2023-12-06 LAB — HM DIABETES EYE EXAM

## 2023-12-12 ENCOUNTER — Ambulatory Visit: Admitting: Family Medicine

## 2023-12-12 ENCOUNTER — Encounter: Payer: Self-pay | Admitting: Family Medicine

## 2023-12-12 VITALS — BP 154/78 | HR 54 | Temp 97.7°F | Wt 217.0 lb

## 2023-12-12 DIAGNOSIS — E1142 Type 2 diabetes mellitus with diabetic polyneuropathy: Secondary | ICD-10-CM | POA: Diagnosis not present

## 2023-12-12 DIAGNOSIS — I10 Essential (primary) hypertension: Secondary | ICD-10-CM | POA: Diagnosis not present

## 2023-12-12 DIAGNOSIS — E785 Hyperlipidemia, unspecified: Secondary | ICD-10-CM | POA: Diagnosis not present

## 2023-12-12 LAB — BAYER DCA HB A1C WAIVED: HB A1C (BAYER DCA - WAIVED): 6.5 % — ABNORMAL HIGH (ref 4.8–5.6)

## 2023-12-12 MED ORDER — METFORMIN HCL ER 500 MG PO TB24: 1000.0000 mg | ORAL_TABLET | Freq: Two times a day (BID) | ORAL | 1 refills | Status: DC

## 2023-12-12 MED ORDER — ROSUVASTATIN CALCIUM 5 MG PO TABS: 5.0000 mg | ORAL_TABLET | Freq: Every day | ORAL | 1 refills | Status: DC

## 2023-12-12 MED ORDER — LOSARTAN POTASSIUM-HCTZ 100-25 MG PO TABS: 1.0000 | ORAL_TABLET | Freq: Every day | ORAL | 1 refills | Status: DC

## 2023-12-12 MED ORDER — SITAGLIPTIN PHOSPHATE 100 MG PO TABS: 100.0000 mg | ORAL_TABLET | Freq: Every day | ORAL | 1 refills | Status: DC

## 2023-12-12 MED ORDER — BUDESONIDE-FORMOTEROL FUMARATE 160-4.5 MCG/ACT IN AERO: 2.0000 | INHALATION_SPRAY | Freq: Two times a day (BID) | RESPIRATORY_TRACT | 3 refills | Status: DC

## 2023-12-12 MED ORDER — HYDRALAZINE HCL 50 MG PO TABS: 50.0000 mg | ORAL_TABLET | Freq: Two times a day (BID) | ORAL | 1 refills | Status: DC

## 2023-12-12 MED ORDER — PREGABALIN 75 MG PO CAPS: ORAL_CAPSULE | ORAL | 1 refills | Status: DC

## 2023-12-12 NOTE — Assessment & Plan Note (Signed)
 Doing great with A1c of 6.5 down from 6.7. Continue current regimen. Continue to monitor. Call with any concerns.

## 2023-12-12 NOTE — Assessment & Plan Note (Signed)
 Running high- will work on [employer] and recheck in 3 months, if still high will adjust medication. Call with any concerns. Refills given. Labs drawn today.

## 2023-12-12 NOTE — Assessment & Plan Note (Signed)
 Under good control on current regimen. Continue current regimen. Continue to monitor. Call with any concerns. Refills given. Labs drawn today.

## 2023-12-12 NOTE — Progress Notes (Signed)
 BP (!) 154/78   Pulse (!) 54   Temp 97.7 F (36.5 C) (Oral)   Wt 217 lb (98.4 kg)   SpO2 98%   BMI 30.04 kg/m    Subjective:    Patient ID: Shawn Shaw, male    DOB: 13-Jul-1960, 63 y.o.   MRN: [id number]  HPI: Shawn Shaw is a 63 y.o. male  Chief Complaint  Patient presents with   Diabetes   DIABETES Hypoglycemic episodes:no Polydipsia/polyuria: no Visual disturbance: no Chest pain: no Paresthesias: no Glucose Monitoring: no  Accucheck frequency: Not Checking Taking Insulin?: no Blood Pressure Monitoring: rarely Retinal Examination: Up to Date Foot Exam: Up to Date Diabetic Education: Completed Pneumovax: Up to Date Influenza: Up to Date Aspirin: yes  HYPERTENSION / HYPERLIPIDEMIA Satisfied with current treatment? yes Duration of hypertension: chronic BP monitoring frequency: not checking BP medication side effects: no Past BP meds: losartan , hydrochlorothiazide, hydralazine  Duration of hyperlipidemia: chronic Cholesterol medication side effects: no Cholesterol supplements: none Past cholesterol medications: crestor  Medication compliance: excellent compliance Aspirin: no Recent stressors: no Recurrent headaches: no Visual changes: no Palpitations: no Dyspnea: no Chest pain: no Lower extremity edema: no Dizzy/lightheaded: no  Relevant past medical, surgical, family and social history reviewed and updated as indicated. Interim medical history since our last visit reviewed. Allergies and medications reviewed and updated.  Review of Systems  Constitutional: Negative.   Respiratory: Negative.    Cardiovascular: Negative.   Musculoskeletal: Negative.   Neurological: Negative.   Psychiatric/Behavioral: Negative.      Per HPI unless specifically indicated above     Objective:    BP (!) 154/78   Pulse (!) 54   Temp 97.7 F (36.5 C) (Oral)   Wt 217 lb (98.4 kg)   SpO2 98%   BMI 30.04 kg/m   Wt Readings from Last 3 Encounters:  12/12/23  217 lb (98.4 kg)  08/17/23 217 lb 12.8 oz (98.8 kg)  07/02/23 222 lb 6.4 oz (100.9 kg)    Physical Exam Vitals and nursing note reviewed.  Constitutional:      General: He is not in acute distress.    Appearance: Normal appearance. He is not ill-appearing, toxic-appearing or diaphoretic.  HENT:     Head: Normocephalic and atraumatic.     Right Ear: External ear normal.     Left Ear: External ear normal.     Nose: Nose normal.     Mouth/Throat:     Mouth: Mucous membranes are moist.     Pharynx: Oropharynx is clear.  Eyes:     General: No scleral icterus.       Right eye: No discharge.        Left eye: No discharge.     Extraocular Movements: Extraocular movements intact.     Conjunctiva/sclera: Conjunctivae normal.     Pupils: Pupils are equal, round, and reactive to light.  Cardiovascular:     Rate and Rhythm: Normal rate and regular rhythm.     Pulses: Normal pulses.     Heart sounds: Normal heart sounds. No murmur heard.    No friction rub. No gallop.  Pulmonary:     Effort: Pulmonary effort is normal. No respiratory distress.     Breath sounds: Normal breath sounds. No stridor. No wheezing, rhonchi or rales.  Chest:     Chest wall: No tenderness.  Musculoskeletal:        General: Normal range of motion.     Cervical back: Normal range of motion and neck  supple.  Skin:    General: Skin is warm and dry.     Capillary Refill: Capillary refill takes less than 2 seconds.     Coloration: Skin is not jaundiced or pale.     Findings: No bruising, erythema, lesion or rash.  Neurological:     General: No focal deficit present.     Mental Status: He is alert and oriented to person, place, and time. Mental status is at baseline.  Psychiatric:        Mood and Affect: Mood normal.        Behavior: Behavior normal.        Thought Content: Thought content normal.        Judgment: Judgment normal.     Results for orders placed or performed in visit on 08/17/23  Bayer DCA Hb  A1c Waived   Collection Time: 08/17/23 10:07 AM  Result Value Ref Range   HB A1C (BAYER DCA - WAIVED) 6.7 (H) 4.8 - 5.6 %      Assessment & Plan:   Problem List Items Addressed This Visit       Cardiovascular and Mediastinum   Hypertension   Running high- will work on [employer] diet and recheck in 3 months, if still high will adjust medication. Call with any concerns. Refills given. Labs drawn today.       Relevant Medications   hydrALAZINE  (APRESOLINE ) 50 MG tablet   losartan -hydrochlorothiazide (HYZAAR) 100-25 MG tablet   rosuvastatin  (CRESTOR ) 5 MG tablet   Other Relevant Orders   CBC with Differential/Platelet   Comprehensive metabolic panel with GFR   Lipid Panel w/o Chol/HDL Ratio     Endocrine   DM type 2 with diabetic peripheral neuropathy (HCC) - Primary   Doing great with A1c of 6.5 down from 6.7. Continue current regimen. Continue to monitor. Call with any concerns.       Relevant Medications   losartan -hydrochlorothiazide (HYZAAR) 100-25 MG tablet   metFORMIN  (GLUCOPHAGE -XR) 500 MG 24 hr tablet   pregabalin  (LYRICA ) 75 MG capsule   rosuvastatin  (CRESTOR ) 5 MG tablet   sitaGLIPtin  (JANUVIA ) 100 MG tablet   Other Relevant Orders   Bayer DCA Hb A1c Waived   CBC with Differential/Platelet   Comprehensive metabolic panel with GFR   Lipid Panel w/o Chol/HDL Ratio     Other   Hyperlipidemia   Under good control on current regimen. Continue current regimen. Continue to monitor. Call with any concerns. Refills given. Labs drawn today.        Relevant Medications   hydrALAZINE  (APRESOLINE ) 50 MG tablet   losartan -hydrochlorothiazide (HYZAAR) 100-25 MG tablet   rosuvastatin  (CRESTOR ) 5 MG tablet   Other Relevant Orders   CBC with Differential/Platelet   Comprehensive metabolic panel with GFR   Lipid Panel w/o Chol/HDL Ratio     Follow up plan: Return in about 3 months (around 03/13/2024).

## 2023-12-13 ENCOUNTER — Ambulatory Visit: Payer: Self-pay | Admitting: Family Medicine

## 2023-12-13 LAB — LIPID PANEL W/O CHOL/HDL RATIO
Cholesterol, Total: 126 mg/dL (ref 100–199)
HDL: 36 mg/dL — ABNORMAL LOW (ref >39)
LDL Chol Calc (NIH): 67 mg/dL (ref 0–99)
Triglycerides: 126 mg/dL (ref 0–149)
VLDL Cholesterol Cal: 23 mg/dL (ref 5–40)

## 2023-12-13 LAB — COMPREHENSIVE METABOLIC PANEL WITH GFR
ALT: 20 IU/L (ref 0–44)
AST: 19 IU/L (ref 0–40)
Albumin: 4.2 g/dL (ref 3.9–4.9)
Alkaline Phosphatase: 62 IU/L (ref 44–121)
BUN/Creatinine Ratio: 18 (ref 10–24)
BUN: 19 mg/dL (ref 8–27)
Bilirubin Total: 0.4 mg/dL (ref 0.0–1.2)
CO2: 19 mmol/L — ABNORMAL LOW (ref 20–29)
Calcium: 9.6 mg/dL (ref 8.6–10.2)
Chloride: 104 mmol/L (ref 96–106)
Creatinine, Ser: 1.07 mg/dL (ref 0.76–1.27)
Globulin, Total: 2.6 g/dL (ref 1.5–4.5)
Glucose: 111 mg/dL — ABNORMAL HIGH (ref 70–99)
Potassium: 4.3 mmol/L (ref 3.5–5.2)
Sodium: 138 mmol/L (ref 134–144)
Total Protein: 6.8 g/dL (ref 6.0–8.5)
eGFR: 78 mL/min/1.73 (ref >59)

## 2023-12-13 LAB — CBC WITH DIFFERENTIAL/PLATELET
Basophils Absolute: 0.1 x10E3/uL (ref 0.0–0.2)
Basos: 1 %
EOS (ABSOLUTE): 0.3 x10E3/uL (ref 0.0–0.4)
Eos: 4 %
Hematocrit: 41.5 % (ref 37.5–51.0)
Hemoglobin: 13.4 g/dL (ref 13.0–17.7)
Immature Grans (Abs): 0 x10E3/uL (ref 0.0–0.1)
Immature Granulocytes: 0 %
Lymphocytes Absolute: 1.7 x10E3/uL (ref 0.7–3.1)
Lymphs: 25 %
MCH: 29.6 pg (ref 26.6–33.0)
MCHC: 32.3 g/dL (ref 31.5–35.7)
MCV: 92 fL (ref 79–97)
Monocytes Absolute: 0.6 x10E3/uL (ref 0.1–0.9)
Monocytes: 8 %
Neutrophils Absolute: 4.5 x10E3/uL (ref 1.4–7.0)
Neutrophils: 62 %
Platelets: 304 x10E3/uL (ref 150–450)
RBC: 4.52 x10E6/uL (ref 4.14–5.80)
RDW: 13.7 % (ref 11.6–15.4)
WBC: 7.1 x10E3/uL (ref 3.4–10.8)

## 2023-12-18 ENCOUNTER — Encounter: Payer: Self-pay | Admitting: Family Medicine

## 2024-01-10 ENCOUNTER — Other Ambulatory Visit: Payer: Self-pay

## 2024-01-10 ENCOUNTER — Emergency Department: Admission: EM | Admit: 2024-01-10 | Discharge: 2024-01-10 | Disposition: A | Discharge status: Home / Self Care

## 2024-01-10 ENCOUNTER — Emergency Department

## 2024-01-10 DIAGNOSIS — M25531 Pain in right wrist: Secondary | ICD-10-CM | POA: Insufficient documentation

## 2024-01-10 DIAGNOSIS — I10 Essential (primary) hypertension: Secondary | ICD-10-CM | POA: Diagnosis not present

## 2024-01-10 DIAGNOSIS — J189 Pneumonia, unspecified organism: Secondary | ICD-10-CM

## 2024-01-10 DIAGNOSIS — S20211A Contusion of right front wall of thorax, initial encounter: Secondary | ICD-10-CM | POA: Diagnosis not present

## 2024-01-10 DIAGNOSIS — S299XXA Unspecified injury of thorax, initial encounter: Secondary | ICD-10-CM | POA: Diagnosis present

## 2024-01-10 DIAGNOSIS — J181 Lobar pneumonia, unspecified organism: Secondary | ICD-10-CM | POA: Diagnosis not present

## 2024-01-10 DIAGNOSIS — M25559 Pain in unspecified hip: Secondary | ICD-10-CM

## 2024-01-10 DIAGNOSIS — M25551 Pain in right hip: Secondary | ICD-10-CM | POA: Diagnosis not present

## 2024-01-10 DIAGNOSIS — Y92410 Unspecified street and highway as the place of occurrence of the external cause: Secondary | ICD-10-CM | POA: Diagnosis not present

## 2024-01-10 DIAGNOSIS — M25539 Pain in unspecified wrist: Secondary | ICD-10-CM

## 2024-01-10 MED ORDER — CEFDINIR 300 MG PO CAPS: 300.0000 mg | ORAL_CAPSULE | Freq: Two times a day (BID) | ORAL | 0 refills | Status: AC

## 2024-01-10 MED ORDER — CEFUROXIME AXETIL 500 MG PO TABS
500.0000 mg | ORAL_TABLET | Freq: Once | ORAL | Status: AC
  Administered 2024-01-10: 500 mg via ORAL
  Filled 2024-01-10: qty 1

## 2024-01-10 MED ORDER — METHOCARBAMOL 500 MG PO TABS
750.0000 mg | ORAL_TABLET | Freq: Once | ORAL | Status: AC
  Administered 2024-01-10: 750 mg via ORAL
  Filled 2024-01-10: qty 2

## 2024-01-10 MED ORDER — DOXYCYCLINE HYCLATE 100 MG PO TABS
100.0000 mg | ORAL_TABLET | Freq: Once | ORAL | Status: AC
  Administered 2024-01-10: 100 mg via ORAL
  Filled 2024-01-10: qty 1

## 2024-01-10 MED ORDER — DOXYCYCLINE HYCLATE 100 MG PO TABS: 100.0000 mg | ORAL_TABLET | Freq: Two times a day (BID) | ORAL | 0 refills | Status: AC

## 2024-01-10 MED ORDER — OXYCODONE-ACETAMINOPHEN 5-325 MG PO TABS
1.0000 | ORAL_TABLET | Freq: Once | ORAL | Status: AC
  Administered 2024-01-10: 1 via ORAL
  Filled 2024-01-10: qty 1

## 2024-01-10 MED ORDER — OXYCODONE-ACETAMINOPHEN 5-325 MG PO TABS
1.0000 | ORAL_TABLET | ORAL | 0 refills | Status: AC | PRN
Start: 2024-01-10 — End: 2024-01-13
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
## 2024-01-10 NOTE — ED Notes (Signed)
 Patient transported to CT
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
## 2024-02-05 ENCOUNTER — Other Ambulatory Visit: Payer: Self-pay | Admitting: Family Medicine

## 2024-02-06 ENCOUNTER — Telehealth: Payer: Self-pay | Admitting: Family Medicine
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
## 2024-02-07 ENCOUNTER — Encounter: Payer: Self-pay | Admitting: Family Medicine

## 2024-02-08 NOTE — Telephone Encounter (Signed)
 Scheduled

## 2024-02-14 ENCOUNTER — Encounter: Payer: Self-pay | Admitting: Family Medicine

## 2024-02-14 ENCOUNTER — Ambulatory Visit: Admitting: Family Medicine

## 2024-02-14 VITALS — BP 138/64 | HR 57 | Temp 98.6°F | Resp 14 | Ht 71.26 in | Wt 218.6 lb

## 2024-02-14 DIAGNOSIS — Z23 Encounter for immunization: Secondary | ICD-10-CM | POA: Diagnosis not present

## 2024-02-14 DIAGNOSIS — Z87828 Personal history of other (healed) physical injury and trauma: Secondary | ICD-10-CM

## 2024-02-14 DIAGNOSIS — Z8701 Personal history of pneumonia (recurrent): Secondary | ICD-10-CM
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
## 2024-03-14 ENCOUNTER — Encounter: Payer: Self-pay | Admitting: Family Medicine

## 2024-03-14 ENCOUNTER — Ambulatory Visit: Admitting: Family Medicine

## 2024-03-14 VITALS — BP 138/80 | HR 64 | Temp 98.0°F | Ht 71.0 in

## 2024-03-14 DIAGNOSIS — J0100 Acute maxillary sinusitis, unspecified: Secondary | ICD-10-CM | POA: Diagnosis not present

## 2024-03-14 DIAGNOSIS — E1142 Type 2 diabetes mellitus with diabetic polyneuropathy: Secondary | ICD-10-CM

## 2024-03-14 LAB — BAYER DCA HB A1C WAIVED: HB A1C (BAYER DCA - WAIVED): 6.2 % — ABNORMAL HIGH (ref 4.8–5.6)

## 2024-03-14 MED ORDER — AMOXICILLIN-POT CLAVULANATE 875-125 MG PO TABS: 1.0000 | ORAL_TABLET | Freq: Two times a day (BID) | ORAL | 0 refills | Status: DC

## 2024-03-14 NOTE — Assessment & Plan Note (Signed)
 Doing great with A1c of 6.3. Continue current regimen. Call with any concerns.

## 2024-03-14 NOTE — Progress Notes (Signed)
 BP 138/80   Pulse 64   Temp 98 F (36.7 C) (Oral)   Ht 5' 11 (1.803 m)   SpO2 96%   BMI 30.49 kg/m    Subjective:    Patient ID: Shawn Shaw, male    DOB: 05-03-1961, 63 y.o.   MRN: [id number]  HPI: Yoshio Seliga is a 63 y.o. male  Chief Complaint  Patient presents with   Diabetes    .   UPPER RESPIRATORY TRACT INFECTION Duration: 3 weeks Worst symptom: congestion Fever: no Cough: no Shortness of breath: no Wheezing: no Chest pain: no Chest tightness: no Chest congestion: no Nasal congestion: yes Runny nose: yes Post nasal drip: yes Sneezing: no Sore throat: yes Swollen glands: no Sinus pressure: yes Headache: yes Face pain: no Toothache: yes Ear pain: no  Ear pressure: no  Eyes red/itching:yes Eye drainage/crusting: no  Vomiting: no Rash: no Fatigue: yes Sick contacts: no Strep contacts: no  Context: stable Recurrent sinusitis: no Relief with OTC cold/cough medications: no  Treatments attempted: cold/sinus, mucinex, and anti-histamine   DIABETES Hypoglycemic episodes:no Polydipsia/polyuria: no Visual disturbance: no Chest pain: no Paresthesias: no Glucose Monitoring: no Taking Insulin?: no Blood Pressure Monitoring: not checking Retinal Examination: Up to Date Foot Exam: Up to Date Diabetic Education: Completed Pneumovax: Up to Date Influenza: Not up to Date Aspirin: no  Relevant past medical, surgical, family and social history reviewed and updated as indicated. Interim medical history since our last visit reviewed. Allergies and medications reviewed and updated.  Review of Systems  Constitutional:  Positive for fatigue. Negative for activity change, appetite change, chills, diaphoresis, fever and unexpected weight change.  HENT:  Positive for congestion, ear pain, postnasal drip, rhinorrhea, sinus pressure and sinus pain. Negative for dental problem, drooling, ear discharge, facial swelling, hearing loss, mouth sores, nosebleeds,  sneezing, sore throat, tinnitus, trouble swallowing and voice change.   Eyes: Negative.   Respiratory: Negative.    Cardiovascular: Negative.   Gastrointestinal: Negative.   Neurological: Negative.   Psychiatric/Behavioral: Negative.      Per HPI unless specifically indicated above     Objective:    BP 138/80   Pulse 64   Temp 98 F (36.7 C) (Oral)   Ht 5' 11 (1.803 m)   SpO2 96%   BMI 30.49 kg/m   Wt Readings from Last 3 Encounters:  02/14/24 218 lb 9.6 oz (99.2 kg)  01/10/24 216 lb 0.8 oz (98 kg)  12/12/23 217 lb (98.4 kg)    Physical Exam Vitals and nursing note reviewed.  Constitutional:      General: He is not in acute distress.    Appearance: Normal appearance. He is not ill-appearing, toxic-appearing or diaphoretic.  HENT:     Head: Normocephalic and atraumatic.     Right Ear: External ear normal.     Left Ear: External ear normal.     Nose: Nose normal.     Mouth/Throat:     Mouth: Mucous membranes are moist.     Pharynx: Oropharynx is clear.  Eyes:     General: No scleral icterus.       Right eye: No discharge.        Left eye: No discharge.     Extraocular Movements: Extraocular movements intact.     Conjunctiva/sclera: Conjunctivae normal.     Pupils: Pupils are equal, round, and reactive to light.  Cardiovascular:     Rate and Rhythm: Normal rate and regular rhythm.     Pulses: Normal  pulses.     Heart sounds: Normal heart sounds. No murmur heard.    No friction rub. No gallop.  Pulmonary:     Effort: Pulmonary effort is normal. No respiratory distress.     Breath sounds: Normal breath sounds. No stridor. No wheezing, rhonchi or rales.  Chest:     Chest wall: No tenderness.  Musculoskeletal:        General: Normal range of motion.     Cervical back: Normal range of motion and neck supple.  Skin:    General: Skin is warm and dry.     Capillary Refill: Capillary refill takes less than 2 seconds.     Coloration: Skin is not jaundiced or pale.      Findings: No bruising, erythema, lesion or rash.  Neurological:     General: No focal deficit present.     Mental Status: He is alert and oriented to person, place, and time. Mental status is at baseline.  Psychiatric:        Mood and Affect: Mood normal.        Behavior: Behavior normal.        Thought Content: Thought content normal.        Judgment: Judgment normal.     Results for orders placed or performed in visit on 12/19/23  HM DIABETES EYE EXAM   Collection Time: 12/06/23 12:54 PM  Result Value Ref Range   HM Diabetic Eye Exam No Retinopathy No Retinopathy      Assessment & Plan:   Problem List Items Addressed This Visit       Endocrine   DM type 2 with diabetic peripheral neuropathy (HCC) - Primary   Doing great with A1c of 6.3. Continue current regimen. Call with any concerns.       Relevant Orders   Bayer DCA Hb A1c Waived   Other Visit Diagnoses       Acute non-recurrent maxillary sinusitis       Will treat with augmentin. Call with any concerns.   Relevant Medications   amoxicillin-clavulanate (AUGMENTIN) 875-125 MG tablet        Follow up plan: Return in about 3 months (around 06/14/2024) for physical.

## 2024-06-04 ENCOUNTER — Ambulatory Visit
Admission: EM | Admit: 2024-06-04 | Discharge: 2024-06-04 | Disposition: A | Discharge status: Home / Self Care | Attending: Family Medicine | Admitting: Family Medicine

## 2024-06-04 DIAGNOSIS — M5417 Radiculopathy, lumbosacral region: Secondary | ICD-10-CM | POA: Diagnosis not present

## 2024-06-04 DIAGNOSIS — I10 Essential (primary) hypertension: Secondary | ICD-10-CM

## 2024-06-04 MED ORDER — PREDNISONE 10 MG (21) PO TBPK: ORAL_TABLET | Freq: Every day | ORAL | 0 refills | Status: DC

## 2024-06-04 MED ORDER — HYDROCODONE-ACETAMINOPHEN 5-325 MG PO TABS: 2.0000 | ORAL_TABLET | ORAL | 0 refills | Status: AC | PRN

## 2024-06-04 MED ORDER — DEXAMETHASONE SOD PHOSPHATE PF 10 MG/ML IJ SOLN
10.0000 mg | Freq: Once | INTRAMUSCULAR | Status: AC
  Administered 2024-06-04: 10 mg via INTRAMUSCULAR

## 2024-06-04 MED ORDER — CYCLOBENZAPRINE HCL 5 MG PO TABS: 5.0000 mg | ORAL_TABLET | Freq: Three times a day (TID) | ORAL | 0 refills | Status: AC | PRN

## 2024-06-04 NOTE — Discharge Instructions (Signed)
 If medication was prescribed, stop by the pharmacy to pick up your prescriptions.  For your back pain, Take 1500 mg Tylenol  twice a day, take muscle relaxer and Norco,  as needed for pain. Start taking prednisone  tomorrow. Consider stopping by the pharmacy or dollar store to pick up some Lidocaine patches. Apply for 12 hours and then remove.   Watch for worsening symptoms such as an increasing weakness or loss of sensation in your arms or legs, increasing pain and/or the loss of bladder or bowel function. Should any of these occur, go to the emergency department immediately.

## 2024-06-04 NOTE — ED Provider Notes (Signed)
 " [facility]    CSN: [id number] Arrival date & time: 06/04/24  1126      History   Chief Complaint Chief Complaint  Patient presents with   Back Pain    HPI  HPI Shawn Shaw is a 64 y.o. male.   Shawn Shaw presents for low back pain that started a week ago after working on a golf cart. He reached for something and felt a pull.  A couple days ago he was working on a trailer and pulled something and had pain on both sides of his back.  Has been taking Tylenol  and ibuprofen without relief.  Tried heat and ice. Stretching didn't really help.  Denies falls and trauma. Pain is described as throbbing pressure and does radiate down both of his legs. Pain rated 8/10. Sitting makes the pain better and movement and standing make the pain worse. He has not been able to sleep without pain. At times, he can move the right way and the pain goes away. Continues to have pain with movement. [name] does feel like his legs are weak.   Has  injured his back before. He is a retied designer, fashion/clothing.     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 Authorizing Provider  cyclobenzaprine  (FLEXERIL )  5 MG tablet Take 1 tablet (5 mg total) by mouth 3 (three) times daily as needed. 06/04/24  Yes Shawn Go, DO  HYDROcodone -acetaminophen  (NORCO/VICODIN) 5-325 MG tablet Take 2 tablets by mouth every 4 (four) hours as needed. 06/04/24  Yes Shawn Mcneely, DO  predniSONE  (STERAPRED UNI-PAK 21 TAB) 10 MG (21) TBPK tablet Take by mouth daily. Take 6 tabs by mouth daily for 1, then 5 tabs for 1 day, then 4 tabs for 1 day, then 3 tabs for 1 day, then 2 tabs for 1 day, then 1 tab for 1 day. 06/06/24  Yes Shawn Haggard, DO  amLODipine  (NORVASC ) 10 MG tablet Take 10 mg by mouth.    [provider]  budesonide -formoterol  (SYMBICORT ) 160-4.5 MCG/ACT inhaler Inhale 2 puffs into the lungs 2 (two) times daily. 12/12/23   Johnson, Megan P, DO  clindamycin -benzoyl peroxide (BENZACLIN) gel Apply topically 2 (two) times daily. 05/11/23   Johnson, Megan P, DO  finasteride (PROSCAR) 5 MG tablet 5 mg daily. 07/27/15   [provider]  hydrALAZINE  (APRESOLINE ) 50 MG tablet Take 1 tablet (50 mg total) by mouth 2 (two) times daily. 12/12/23   Johnson, Megan P, DO  losartan -hydrochlorothiazide (HYZAAR) 100-25 MG tablet Take 1 tablet by mouth daily. 12/12/23   Shawn Shaw P, DO  metFORMIN  (GLUCOPHAGE -XR) 500 MG 24 hr tablet Take 2 tablets (1,000 mg total) by mouth in the morning and at bedtime. 12/12/23   Shawn Shaw P, DO  pregabalin  (LYRICA ) 75 MG capsule Take 1 in the AM and 2 in the PM 12/12/23   Johnson, Megan P, DO  rosuvastatin  (CRESTOR ) 5 MG tablet Take 1 tablet (5 mg total) by mouth daily. 12/12/23   Johnson, Megan P, DO  sildenafil (REVATIO) 20 MG tablet Take 20 mg by mouth daily as needed. 08/14/19   [provider]  sitaGLIPtin  (JANUVIA ) 100 MG tablet Take 1 tablet (100 mg total) by mouth daily. 12/12/23   Johnson, Megan P, DO  triamcinolone  cream (KENALOG ) 0.1 % Apply 1 application topically 2 (two) times daily. 08/10/23   Shawn Shaw SQUIBB, DO    Family History Family History  Family  history unknown: Yes    Social History Social History[1]   Allergies   Trulicity  [dulaglutide ] and Lisinopril   Review of Systems Review of Systems: egative unless otherwise stated in HPI.      Physical Exam Triage Vital Signs ED Triage Vitals  Encounter Vitals Group     BP 06/04/24 1158 (!) 162/77     Girls Systolic BP Percentile --      Girls Diastolic BP Percentile --      Boys Systolic BP Percentile --      Boys Diastolic BP Percentile --      Pulse Rate 06/04/24 1158 70     Resp 06/04/24 1158 16     Temp 06/04/24 1158 98.5 F (36.9 C)     Temp Source 06/04/24 1158 Oral     SpO2 06/04/24 1158 95 %     Weight 06/04/24 1156 217 lb 2.5 oz (98.5 kg)     Height --      Head Circumference --      Peak Flow --      Pain Score 06/04/24 1157 8     Pain Loc --      Pain Education --      Exclude from Growth Chart --    No data found.  Updated Vital Signs BP (!) 162/77 (BP Location: Left Arm)   Pulse 70   Temp 98.5 F (36.9 C) (Oral)   Resp 16   Wt 98.5 kg   SpO2 95%   BMI 30.29 kg/m   Visual Acuity Right Eye Distance:   Left Eye Distance:   Bilateral Distance:    Right Eye Near:   Left Eye Near:    Bilateral Near:     Physical Exam GEN: well appearing male in no acute distress  CVS: well perfused  RESP: speaking in full sentences without pause, no respiratory distress  MSK:  Thoracic and Lumbar Spine: - Inspection: no gross deformity or asymmetry, swelling or ecchymosis. No skin changes  - Palpation: No TTP over the lumbar spinous processes, bilateral lumbar paraspinal muscle tenderness and hypertonicity, no SI joint tenderness bilaterally - ROM: limited active ROM of the lumbar spine in flexion and extension due to acute pain, good thoracic rotation   - Strength: 5/5 strength of lower extremity in L4-S1 nerve root distributions b/l - Neuro: sensation intact in the L4-S1 nerve root distribution b/l - Special testing: Negative straight leg  raise SKIN: warm, dry, no overly skin rash or erythema    UC Treatments / Results  Labs (all labs ordered are listed, but only abnormal results are displayed) Labs Reviewed - No data to  display  EKG   Radiology No results found.   Procedures Procedures (including critical care time)  Medications Ordered in UC Medications  dexamethasone  (DECADRON ) injection 10 mg (10 mg Intramuscular Given 06/04/24 1255)    Initial Impression / Assessment and Plan / UC Course  I have reviewed the triage vital signs and the nursing notes.  Pertinent labs & imaging results that were available during my care of the patient were reviewed by me and considered in my medical decision making (see chart for details).      Pt is a 64 y.o.  male with a week of lower back pain after reaching over objects.  He is hypertensive here which I suspect is secondary to pain.  Patient is uncomfortable appearing on exam but has no midline tenderness therefore imaging is deferred.SABRA  Has history of low back pain.  On chart review, previous imaging showed degenerative changes.   We discussed pain control and he is agreeable to Decadron  injection which has helped him in the past.  Given Decadron  10 mg IM.  Patient to gradually return to normal activities, as tolerated and continue ordinary activities within the limits permitted by pain. Prescribed short course of Norco, prednisone  taper and muscle relaxer  for pain relief.  Advised patient to avoid  NSAIDs while taking prednisone . Tylenol  and Lidocaine patches PRN for multimodal pain relief. Counseled patient on red flag symptoms and when to seek immediate care.  No red flags suggesting cauda equina syndrome or progressive major motor weakness. Patient to follow up with orthopedic provider if symptoms do not improve with conservative treatment.  Return and ED precautions given.    Discussed MDM, treatment plan and plan for follow-up with patient who agrees with plan.    Final Clinical Impressions(s) / UC Diagnoses   Final diagnoses:  Elevated blood pressure reading with diagnosis of hypertension  Lumbosacral radiculitis     Discharge Instructions      If medication was prescribed, stop by the pharmacy to pick up your prescriptions.  For your back pain, Take 1500 mg Tylenol  twice a day, take muscle relaxer and Norco,  as needed for pain. Start taking prednisone  tomorrow. Consider stopping by the pharmacy or dollar store to pick up some Lidocaine patches. Apply for 12 hours and then remove.   Watch for worsening symptoms such as an increasing weakness or loss of sensation in your arms or legs, increasing pain and/or the loss of bladder or bowel function. Should any of these occur, Shaw to the emergency department immediately.       ED Prescriptions     Medication Sig Dispense Auth. Provider   predniSONE  (STERAPRED UNI-PAK 21 TAB) 10 MG (21) TBPK tablet Take by mouth daily. Take 6 tabs by mouth daily for 1, then 5 tabs for 1 day, then 4 tabs for 1 day, then 3 tabs for 1 day, then 2 tabs for 1 day, then 1 tab for 1 day. 21 tablet Alano Blasco, DO   cyclobenzaprine  (FLEXERIL ) 5 MG tablet Take 1 tablet (5 mg total) by mouth 3 (three) times daily as needed. 30 tablet Jassiel Flye, DO   HYDROcodone -acetaminophen  (NORCO/VICODIN) 5-325 MG tablet Take 2 tablets by mouth every 4 (four) hours as needed. 10 tablet Heman Que, DO      I have reviewed the PDMP during this encounter.     [1]  Social History Tobacco Use   Smoking status: Former    Current packs/day: 0.00    Types: Cigarettes  Quit date: 12/22/2008    Years since quitting: 15.4   Smokeless tobacco: Never  Vaping Use   Vaping status: Never Used  Substance Use Topics   Alcohol use: Not Currently    Alcohol/week: 0.0 standard drinks of alcohol   Drug use: No     Kriste Berth, DO 06/06/24 1732  "

## 2024-06-04 NOTE — ED Triage Notes (Signed)
 Pt c/o back pain x4 days. States was pulling his trailer & injured his back. Has tried IBU & tylenol  w/o relief. Last dose of IBU around 0730 today.

## 2024-06-20 ENCOUNTER — Ambulatory Visit: Admitting: Family Medicine

## 2024-06-20 ENCOUNTER — Encounter: Payer: Self-pay | Admitting: Family Medicine

## 2024-06-20 VITALS — BP 150/90 | HR 79 | Temp 97.9°F | Resp 17 | Ht 71.0 in | Wt 212.2 lb

## 2024-06-20 DIAGNOSIS — N401 Enlarged prostate with lower urinary tract symptoms: Secondary | ICD-10-CM

## 2024-06-20 DIAGNOSIS — E1142 Type 2 diabetes mellitus with diabetic polyneuropathy: Secondary | ICD-10-CM

## 2024-06-20 DIAGNOSIS — E785 Hyperlipidemia, unspecified: Secondary | ICD-10-CM

## 2024-06-20 DIAGNOSIS — Z0000 Encounter for general adult medical examination without abnormal findings: Secondary | ICD-10-CM

## 2024-06-20 DIAGNOSIS — I10 Essential (primary) hypertension: Secondary | ICD-10-CM

## 2024-06-20 LAB — MICROALBUMIN, URINE WAIVED
Creatinine, Urine Waived: 300 mg/dL (ref 10–300)
Microalb, Ur Waived: 80 mg/L — ABNORMAL HIGH (ref 0–19)

## 2024-06-20 LAB — BAYER DCA HB A1C WAIVED: HB A1C (BAYER DCA - WAIVED): 10.1 % — ABNORMAL HIGH (ref 4.8–5.6)

## 2024-06-20 MED ORDER — HYDRALAZINE HCL 50 MG PO TABS: 50.0000 mg | ORAL_TABLET | Freq: Two times a day (BID) | ORAL | 1 refills | Status: AC

## 2024-06-20 MED ORDER — PREGABALIN 75 MG PO CAPS: ORAL_CAPSULE | ORAL | 1 refills | Status: AC

## 2024-06-20 MED ORDER — TRIAMCINOLONE ACETONIDE 0.1 % EX CREA: TOPICAL_CREAM | Freq: Two times a day (BID) | CUTANEOUS | 0 refills | Status: AC

## 2024-06-20 MED ORDER — BUDESONIDE-FORMOTEROL FUMARATE 160-4.5 MCG/ACT IN AERO: 2.0000 | INHALATION_SPRAY | Freq: Two times a day (BID) | RESPIRATORY_TRACT | 3 refills | Status: AC

## 2024-06-20 MED ORDER — LOSARTAN POTASSIUM-HCTZ 100-25 MG PO TABS: 1.0000 | ORAL_TABLET | Freq: Every day | ORAL | 1 refills | Status: AC

## 2024-06-20 MED ORDER — ROSUVASTATIN CALCIUM 5 MG PO TABS: 5.0000 mg | ORAL_TABLET | Freq: Every day | ORAL | 1 refills | Status: AC

## 2024-06-20 MED ORDER — CLINDAMYCIN PHOS-BENZOYL PEROX 1-5 % EX GEL: Freq: Two times a day (BID) | CUTANEOUS | 0 refills | Status: AC

## 2024-06-20 MED ORDER — METFORMIN HCL ER 500 MG PO TB24: 1000.0000 mg | ORAL_TABLET | Freq: Two times a day (BID) | ORAL | 1 refills | Status: AC

## 2024-06-20 MED ORDER — SITAGLIPTIN PHOSPHATE 100 MG PO TABS: 100.0000 mg | ORAL_TABLET | Freq: Every day | ORAL | 1 refills | Status: AC

## 2024-06-20 NOTE — Progress Notes (Signed)
 "  BP (!) 150/90   Pulse 79   Temp 97.9 F (36.6 C) (Oral)   Resp 17   Ht 5' 11 (1.803 m)   Wt 212 lb 3.2 oz (96.3 kg)   BMI 29.60 kg/m    Subjective:    Patient ID: Shawn Shaw, male    DOB: 01-May-1961, 64 y.o.   MRN: [id number]  HPI: Shawn Shaw is a 64 y.o. male presenting on 06/20/2024 for comprehensive medical examination. Current medical complaints include:  DIABETES- had 2 courses of steroids because his back and his ear and just finished them about a week ago Hypoglycemic episodes:no Polydipsia/polyuria: yes Visual disturbance: yes Chest pain: no Paresthesias: yes Glucose Monitoring: no  Accucheck frequency: Not Checking Taking Insulin?: no Blood Pressure Monitoring: not checking Retinal Examination: Up to Date Foot Exam: Up to Date Diabetic Education: Completed Pneumovax: Up to Date Influenza: Not up to Date Aspirin: yes  HYPERTENSION / HYPERLIPIDEMIA Satisfied with current treatment? yes Duration of hypertension: chronic BP monitoring frequency: not checking BP medication side effects: no Past BP meds: lisinopril, hydrochlorothiazide, hydralazine   Duration of hyperlipidemia: chronic Cholesterol medication side effects: no Cholesterol supplements: none Past cholesterol medications: crestor  Medication compliance: excellent compliance Aspirin: no Recent stressors: no Recurrent headaches: no Visual changes: no Palpitations: no Dyspnea: no Chest pain: no Lower extremity edema: no Dizzy/lightheaded: no  BPH BPH status: controlled Satisfied with current treatment?: yes Medication side effects: no Medication compliance: excellent compliance Duration: chronic Nocturia: 3-4x per night Urinary frequency:yes Incomplete voiding: no Urgency: no Weak urinary stream: no Straining to start stream: no Dysuria: no Onset: gradual Severity: moderate  Interim Problems from his last visit: no  Depression Screen done today and results listed below:      06/20/2024    9:28 AM 03/14/2024    9:54 AM 02/14/2024    9:04 AM 08/17/2023   10:12 AM 07/02/2023   10:45 AM  Depression screen PHQ 2/9  Decreased Interest 0 0 0 0 0  Down, Depressed, Hopeless 0 0 0 0 0  PHQ - 2 Score 0 0 0 0 0  Altered sleeping 0 0 0 0 0  Tired, decreased energy 0 0 0 1 3  Change in appetite 3 0 0 0 0  Feeling bad or failure about yourself  0 0 0 0 0  Trouble concentrating 0 0 0 0 0  Moving slowly or fidgety/restless 0 0 0 0 0  Suicidal thoughts 0 0 0 0 0  PHQ-9 Score 3 0  0  1  3   Difficult doing work/chores Not difficult at all    Not difficult at all     Data saved with a previous flowsheet row definition    Past Medical History:  Past Medical History:  Diagnosis Date   Anxiety    Depression    Diabetes mellitus without complication (HCC)    Hyperlipidemia    Hypertension     Surgical History:  Past Surgical History:  Procedure Laterality Date   PTERYGIUM EXCISION Right     Medications:  Current Outpatient Medications on File Prior to Visit  Medication Sig   cyclobenzaprine  (FLEXERIL ) 5 MG tablet Take 1 tablet (5 mg total) by mouth 3 (three) times daily as needed.   finasteride (PROSCAR) 5 MG tablet 5 mg daily.   HYDROcodone -acetaminophen  (NORCO/VICODIN) 5-325 MG tablet Take 2 tablets by mouth every 4 (four) hours as needed.   sildenafil (REVATIO) 20 MG tablet Take 20 mg by mouth daily as needed.  No current facility-administered medications on file prior to visit.    Allergies:  Allergies[1]  Social History:  Social History   Socioeconomic History   Marital status: Unknown    Spouse name: Not on file   Number of children: Not on file   Years of education: Not on file   Highest education level: Not on file  Occupational History   Not on file  Tobacco Use   Smoking status: Former    Current packs/day: 0.00    Types: Cigarettes    Quit date: 12/22/2008    Years since quitting: 15.5   Smokeless tobacco: Never  Vaping Use   Vaping  status: Never Used  Substance and Sexual Activity   Alcohol use: Not Currently    Alcohol/week: 0.0 standard drinks of alcohol   Drug use: No   Sexual activity: Not Currently  Other Topics Concern   Not on file  Social History Narrative   Not on file   Social Drivers of Health   Tobacco Use: Medium Risk (06/20/2024)   Patient History    Smoking Tobacco Use: Former    Smokeless Tobacco Use: Never    Passive Exposure: Not on [employer] Strain: Not on file  Food Insecurity: Not on file  Transportation Needs: Not on file  Physical Activity: Not on file  Stress: Not on file  Social Connections: Not on file  Intimate Partner Violence: Not on file  Depression (PHQ2-9): Low Risk (06/20/2024)   Depression (PHQ2-9)    PHQ-2 Score: 3  Alcohol Screen: Not on file  Housing: Unknown (03/13/2024)   Received from [facility]   Epic    Unable to Pay for Housing in the Last Year: Not on file    Number of Times Moved in the Last Year: Not on file    At any time in the past 12 months, were you homeless or living in a shelter (including now)?: No  Utilities: Not on file  Health Literacy: Not on file   Tobacco Use History[2] Social History   Substance and Sexual Activity  Alcohol Use Not Currently   Alcohol/week: 0.0 standard drinks of alcohol    Family History:  Family History  Family history unknown: Yes    Past medical history, surgical history, medications, allergies, family history and social history reviewed with patient today and changes made to appropriate areas of the chart.   Review of Systems  Constitutional: Negative.   HENT: Negative.    Eyes:  Positive for blurred vision. Negative for double vision, photophobia, pain, discharge and redness.  Respiratory: Negative.    Cardiovascular:  Positive for palpitations. Negative for chest pain, orthopnea, claudication, leg swelling and PND.  Gastrointestinal:  Positive for heartburn. Negative for  abdominal pain, blood in stool, constipation, diarrhea, melena, nausea and vomiting.  Genitourinary: Negative.   Musculoskeletal: Negative.   Skin: Negative.   Neurological:  Positive for tingling. Negative for dizziness, tremors, sensory change, speech change, focal weakness, seizures, loss of consciousness, weakness and headaches.  Endo/Heme/Allergies:  Positive for polydipsia. Negative for environmental allergies. Does not bruise/bleed easily.  Psychiatric/Behavioral:  Negative for depression, hallucinations, memory loss, substance abuse and suicidal ideas. The patient is nervous/anxious. The patient does not have insomnia.    All other ROS negative except what is listed above and in the HPI.      Objective:    BP (!) 150/90   Pulse 79   Temp 97.9 F (36.6 C) (Oral)  Resp 17   Ht 5' 11 (1.803 m)   Wt 212 lb 3.2 oz (96.3 kg)   BMI 29.60 kg/m   Wt Readings from Last 3 Encounters:  06/20/24 212 lb 3.2 oz (96.3 kg)  06/04/24 217 lb 2.5 oz (98.5 kg)  02/14/24 218 lb 9.6 oz (99.2 kg)    Physical Exam Vitals and nursing note reviewed.  Constitutional:      General: He is not in acute distress.    Appearance: Normal appearance. He is not ill-appearing, toxic-appearing or diaphoretic.  HENT:     Head: Normocephalic and atraumatic.     Right Ear: Tympanic membrane, ear canal and external ear normal. There is no impacted cerumen.     Left Ear: Tympanic membrane, ear canal and external ear normal. There is no impacted cerumen.     Nose: Nose normal. No congestion or rhinorrhea.     Mouth/Throat:     Mouth: Mucous membranes are moist.     Pharynx: Oropharynx is clear. No oropharyngeal exudate or posterior oropharyngeal erythema.  Eyes:     General: No scleral icterus.       Right eye: No discharge.        Left eye: No discharge.     Extraocular Movements: Extraocular movements intact.     Conjunctiva/sclera: Conjunctivae normal.     Pupils: Pupils are equal, round, and reactive  to light.  Neck:     Vascular: No carotid bruit.  Cardiovascular:     Rate and Rhythm: Normal rate and regular rhythm.     Pulses: Normal pulses.     Heart sounds: No murmur heard.    No friction rub. No gallop.  Pulmonary:     Effort: Pulmonary effort is normal. No respiratory distress.     Breath sounds: Normal breath sounds. No stridor. No wheezing, rhonchi or rales.  Chest:     Chest wall: No tenderness.  Abdominal:     General: Abdomen is flat. Bowel sounds are normal. There is no distension.     Palpations: Abdomen is soft. There is no mass.     Tenderness: There is no abdominal tenderness. There is no right CVA tenderness, left CVA tenderness, guarding or rebound.     Hernia: No hernia is present.  Genitourinary:    Comments: Genital exam deferred with shared decision making Musculoskeletal:        General: No swelling, tenderness, deformity or signs of injury.     Cervical back: Normal range of motion and neck supple. No rigidity. No muscular tenderness.     Right lower leg: No edema.     Left lower leg: No edema.  Lymphadenopathy:     Cervical: No cervical adenopathy.  Skin:    General: Skin is warm and dry.     Capillary Refill: Capillary refill takes less than 2 seconds.     Coloration: Skin is not jaundiced or pale.     Findings: No bruising, erythema, lesion or rash.  Neurological:     General: No focal deficit present.     Mental Status: He is alert and oriented to person, place, and time.     Cranial Nerves: No cranial nerve deficit.     Sensory: No sensory deficit.     Motor: No weakness.     Coordination: Coordination normal.     Gait: Gait normal.     Deep Tendon Reflexes: Reflexes normal.  Psychiatric:        Mood and Affect: Mood  normal.        Behavior: Behavior normal.        Thought Content: Thought content normal.        Judgment: Judgment normal.     Results for orders placed or performed in visit on 03/14/24  Bayer DCA Hb A1c Waived    Collection Time: 03/14/24  9:36 AM  Result Value Ref Range   HB A1C (BAYER DCA - WAIVED) 6.2 (H) 4.8 - 5.6 %      Assessment & Plan:   Problem List Items Addressed This Visit       Cardiovascular and Mediastinum   Hypertension   Running high. Will work on [employer] and recheck in 6 weeks. Call with any concerns or if not getting better.      Relevant Medications   hydrALAZINE  (APRESOLINE ) 50 MG tablet   losartan -hydrochlorothiazide (HYZAAR) 100-25 MG tablet   rosuvastatin  (CRESTOR ) 5 MG tablet   Other Relevant Orders   Comprehensive metabolic panel with GFR   CBC with Differential/Platelet   TSH   Microalbumin, Urine Waived     Endocrine   DM type 2 with diabetic peripheral neuropathy (HCC)   Not doing well with A1c up to 10.1 from 6.2- likely due to steroids. Will continue current regimen and recheck in 3 months. Call with any concerns.       Relevant Medications   losartan -hydrochlorothiazide (HYZAAR) 100-25 MG tablet   metFORMIN  (GLUCOPHAGE -XR) 500 MG 24 hr tablet   pregabalin  (LYRICA ) 75 MG capsule   rosuvastatin  (CRESTOR ) 5 MG tablet   sitaGLIPtin  (JANUVIA ) 100 MG tablet   Other Relevant Orders   Comprehensive metabolic panel with GFR   CBC with Differential/Platelet   Bayer DCA Hb A1c Waived   Microalbumin, Urine Waived     Genitourinary   BPH with obstruction/lower urinary tract symptoms   Under good control on current regimen. Continue current regimen. Continue to monitor. Call with any concerns. Refills through urology. Labs drawn today.        Relevant Orders   Comprehensive metabolic panel with GFR   CBC with Differential/Platelet   PSA     Other   Hyperlipidemia   Under good control on current regimen. Continue current regimen. Continue to monitor. Call with any concerns. Refills given. Labs drawn today.        Relevant Medications   hydrALAZINE  (APRESOLINE ) 50 MG tablet   losartan -hydrochlorothiazide (HYZAAR) 100-25 MG tablet   rosuvastatin   (CRESTOR ) 5 MG tablet   Other Relevant Orders   Comprehensive metabolic panel with GFR   CBC with Differential/Platelet   Lipid Panel w/o Chol/HDL Ratio   Other Visit Diagnoses       Routine general medical examination at a health care facility    -  Primary   Vaccines up to date. Screening labs checked today. Colonoscopy up to date. Continue diet and exercise. Call with any concerns.        LABORATORY TESTING:  Health maintenance labs ordered today as discussed above.   The natural history of prostate cancer and ongoing controversy regarding screening and potential treatment outcomes of prostate cancer has been discussed with the patient. The meaning of a false positive PSA and a false negative PSA has been discussed. He indicates understanding of the limitations of this screening test and wishes to proceed with screening PSA testing.   IMMUNIZATIONS:   - Tdap: Tetanus vaccination status reviewed: last tetanus booster within 10 years. - Influenza: Refused - Pneumovax: Up to date -  Prevnar: Up to date - COVID: Refused - HPV: Not applicable - Shingrix vaccine: Refused  SCREENING: - Colonoscopy: Up to date  Discussed with patient purpose of the colonoscopy is to detect colon cancer at curable precancerous or early stages   PATIENT COUNSELING:    Sexuality: Discussed sexually transmitted diseases, partner selection, use of condoms, avoidance of unintended pregnancy  and contraceptive alternatives.   Advised to avoid cigarette smoking.  I discussed with the patient that most people either abstain from alcohol or drink within safe limits (<=14/week and <=4 drinks/occasion for males, <=7/weeks and <= 3 drinks/occasion for females) and that the risk for alcohol disorders and other health effects rises proportionally with the number of drinks per week and how often a drinker exceeds daily limits.  Discussed cessation/primary prevention of drug use and availability of treatment for  abuse.   Diet: Encouraged to adjust caloric intake to maintain  or achieve ideal body weight, to reduce intake of dietary saturated fat and total fat, to limit sodium intake by avoiding high sodium foods and not adding table salt, and to maintain adequate dietary potassium and calcium  preferably from fresh fruits, vegetables, and low-fat dairy products.    stressed the importance of regular exercise  Injury prevention: Discussed safety belts, safety helmets, smoke detector, smoking near bedding or upholstery.   Dental health: Discussed importance of regular tooth brushing, flossing, and dental visits.   Follow up plan: NEXT PREVENTATIVE PHYSICAL DUE IN 1 YEAR. Return in about 6 weeks (around 08/01/2024).     [1]  Allergies Allergen Reactions   Trulicity  [Dulaglutide ] Rash   Lisinopril Cough  [2]  Social History Tobacco Use  Smoking Status Former   Current packs/day: 0.00   Types: Cigarettes   Quit date: 12/22/2008   Years since quitting: 15.5  Smokeless Tobacco Never   "

## 2024-06-20 NOTE — Assessment & Plan Note (Signed)
Under good control on current regimen. Continue current regimen. Continue to monitor. Call with any concerns. Refills through urology. Labs drawn today.

## 2024-06-20 NOTE — Assessment & Plan Note (Signed)
 Under good control on current regimen. Continue current regimen. Continue to monitor. Call with any concerns. Refills given. Labs drawn today.

## 2024-06-20 NOTE — Assessment & Plan Note (Signed)
 Running high. Will work on [employer] and recheck in 6 weeks. Call with any concerns or if not getting better.

## 2024-06-20 NOTE — Assessment & Plan Note (Signed)
 Not doing well with A1c up to 10.1 from 6.2- likely due to steroids. Will continue current regimen and recheck in 3 months. Call with any concerns.

## 2024-08-01 ENCOUNTER — Ambulatory Visit: Admitting: Family Medicine
